# Patient Record
Sex: Male | Born: 1970
Health system: Southern US, Community
[De-identification: ages and names within clinical notes are randomized; demographics above are authoritative.]

## PROBLEM LIST (undated history)

## (undated) ENCOUNTER — Ambulatory Visit (HOSPITAL_COMMUNITY): Payer: BC Managed Care – PPO

## (undated) DIAGNOSIS — F29 Unspecified psychosis not due to a substance or known physiological condition: Secondary | ICD-10-CM

---

## 1997-10-16 ENCOUNTER — Encounter: Admission: RE | Admit: 1997-10-16 | Discharge: 1997-10-16 | Payer: Self-pay | Admitting: Family Medicine

## 1998-04-11 ENCOUNTER — Encounter: Admission: RE | Admit: 1998-04-11 | Discharge: 1998-04-11 | Payer: Self-pay | Admitting: Family Medicine

## 1999-03-24 ENCOUNTER — Emergency Department (HOSPITAL_COMMUNITY): Admission: EM | Admit: 1999-03-24 | Discharge: 1999-03-24 | Payer: Self-pay | Admitting: Emergency Medicine

## 1999-07-17 ENCOUNTER — Encounter: Payer: Self-pay | Admitting: Emergency Medicine

## 1999-07-17 ENCOUNTER — Emergency Department (HOSPITAL_COMMUNITY): Admission: EM | Admit: 1999-07-17 | Discharge: 1999-07-17 | Payer: Self-pay | Admitting: Emergency Medicine

## 1999-07-27 ENCOUNTER — Emergency Department (HOSPITAL_COMMUNITY): Admission: EM | Admit: 1999-07-27 | Discharge: 1999-07-27 | Payer: Self-pay | Admitting: Emergency Medicine

## 1999-08-04 ENCOUNTER — Emergency Department (HOSPITAL_COMMUNITY): Admission: EM | Admit: 1999-08-04 | Discharge: 1999-08-04 | Payer: Self-pay | Admitting: Emergency Medicine

## 2000-02-22 ENCOUNTER — Emergency Department (HOSPITAL_COMMUNITY): Admission: EM | Admit: 2000-02-22 | Discharge: 2000-02-22 | Payer: Self-pay

## 2000-02-22 ENCOUNTER — Encounter: Payer: Self-pay | Admitting: Emergency Medicine

## 2001-06-21 ENCOUNTER — Emergency Department (HOSPITAL_COMMUNITY): Admission: EM | Admit: 2001-06-21 | Discharge: 2001-06-21 | Payer: Self-pay | Admitting: Emergency Medicine

## 2009-03-30 ENCOUNTER — Emergency Department (HOSPITAL_COMMUNITY): Admission: EM | Admit: 2009-03-30 | Discharge: 2009-03-30 | Payer: Self-pay | Admitting: Emergency Medicine

## 2009-04-03 ENCOUNTER — Emergency Department (HOSPITAL_COMMUNITY): Admission: EM | Admit: 2009-04-03 | Discharge: 2009-04-03 | Payer: Self-pay | Admitting: Emergency Medicine

## 2009-10-07 ENCOUNTER — Ambulatory Visit: Payer: Self-pay | Admitting: Internal Medicine

## 2009-10-07 ENCOUNTER — Encounter (INDEPENDENT_AMBULATORY_CARE_PROVIDER_SITE_OTHER): Payer: Self-pay | Admitting: Family Medicine

## 2009-10-07 LAB — CONVERTED CEMR LAB
ALT: 17 units/L (ref 0–53)
AST: 25 units/L (ref 0–37)
Albumin: 4.6 g/dL (ref 3.5–5.2)
CO2: 23 meq/L (ref 19–32)
Calcium: 9.3 mg/dL (ref 8.4–10.5)
Chloride: 106 meq/L (ref 96–112)
Cholesterol: 156 mg/dL (ref 0–200)
Creatinine, Ser: 1.13 mg/dL (ref 0.40–1.50)
Eosinophils Absolute: 0.1 10*3/uL (ref 0.0–0.7)
Lymphocytes Relative: 29 % (ref 12–46)
Lymphs Abs: 2.3 10*3/uL (ref 0.7–4.0)
MCV: 97.7 fL (ref 78.0–100.0)
Monocytes Relative: 5 % (ref 3–12)
Neutrophils Relative %: 65 % (ref 43–77)
Potassium: 4.5 meq/L (ref 3.5–5.3)
RBC: 4.85 M/uL (ref 4.22–5.81)
Sodium: 141 meq/L (ref 135–145)
TSH: 1.878 microintl units/mL (ref 0.350–4.500)
Total Protein: 7.2 g/dL (ref 6.0–8.3)
WBC: 8.1 10*3/uL (ref 4.0–10.5)

## 2009-12-08 ENCOUNTER — Encounter (INDEPENDENT_AMBULATORY_CARE_PROVIDER_SITE_OTHER): Payer: Self-pay | Admitting: Family Medicine

## 2009-12-08 ENCOUNTER — Ambulatory Visit: Payer: Self-pay | Admitting: Internal Medicine

## 2009-12-08 LAB — CONVERTED CEMR LAB
Chlamydia, Swab/Urine, PCR: NEGATIVE
HCV Ab: NEGATIVE
Hep A Total Ab: NEGATIVE
Hep B Core Total Ab: POSITIVE — AB
Hep B S Ab: POSITIVE — AB

## 2010-03-31 ENCOUNTER — Encounter: Admission: RE | Admit: 2010-03-31 | Discharge: 2010-03-31 | Payer: Self-pay | Admitting: General Surgery

## 2010-03-31 ENCOUNTER — Ambulatory Visit (HOSPITAL_COMMUNITY): Admission: RE | Admit: 2010-03-31 | Discharge: 2010-03-31 | Payer: Self-pay | Admitting: General Surgery

## 2010-04-16 ENCOUNTER — Ambulatory Visit (HOSPITAL_BASED_OUTPATIENT_CLINIC_OR_DEPARTMENT_OTHER): Admission: RE | Admit: 2010-04-16 | Discharge: 2010-04-16 | Payer: Self-pay | Admitting: General Surgery

## 2010-08-12 LAB — DIFFERENTIAL
Basophils Absolute: 0 10*3/uL (ref 0.0–0.1)
Basophils Relative: 0 % (ref 0–1)
Basophils Relative: 0 % (ref 0–1)
Eosinophils Absolute: 0.2 10*3/uL (ref 0.0–0.7)
Eosinophils Absolute: 0.2 10*3/uL (ref 0.0–0.7)
Eosinophils Relative: 2 % (ref 0–5)
Lymphs Abs: 2.4 10*3/uL (ref 0.7–4.0)
Monocytes Relative: 8 % (ref 3–12)
Neutro Abs: 4.9 10*3/uL (ref 1.7–7.7)
Neutrophils Relative %: 55 % (ref 43–77)
Neutrophils Relative %: 64 % (ref 43–77)

## 2010-08-12 LAB — CBC
MCH: 31.4 pg (ref 26.0–34.0)
MCH: 32.5 pg (ref 26.0–34.0)
MCHC: 33.7 g/dL (ref 30.0–36.0)
MCHC: 34.4 g/dL (ref 30.0–36.0)
MCV: 94.5 fL (ref 78.0–100.0)
Platelets: 162 10*3/uL (ref 150–400)
Platelets: 187 10*3/uL (ref 150–400)
RBC: 4.55 MIL/uL (ref 4.22–5.81)
RDW: 13 % (ref 11.5–15.5)

## 2010-08-12 LAB — BASIC METABOLIC PANEL
CO2: 28 mEq/L (ref 19–32)
CO2: 29 mEq/L (ref 19–32)
Calcium: 8.9 mg/dL (ref 8.4–10.5)
Calcium: 9 mg/dL (ref 8.4–10.5)
Chloride: 106 mEq/L (ref 96–112)
Creatinine, Ser: 1.02 mg/dL (ref 0.4–1.5)
Creatinine, Ser: 1.17 mg/dL (ref 0.4–1.5)
GFR calc non Af Amer: >60 mL/min (ref >60)
Glucose, Bld: 118 mg/dL — ABNORMAL HIGH (ref 70–99)
Glucose, Bld: 74 mg/dL (ref 70–99)
Sodium: 140 mEq/L (ref 135–145)

## 2010-09-02 LAB — CBC
Hemoglobin: 13.6 g/dL (ref 13.0–17.0)
MCHC: 34.1 g/dL (ref 30.0–36.0)
RBC: 4.09 MIL/uL — ABNORMAL LOW (ref 4.22–5.81)
WBC: 9.5 10*3/uL (ref 4.0–10.5)

## 2010-09-02 LAB — COMPREHENSIVE METABOLIC PANEL
ALT: 34 U/L (ref 0–53)
AST: 34 U/L (ref 0–37)
Alkaline Phosphatase: 63 U/L (ref 39–117)
CO2: 27 mEq/L (ref 19–32)
Calcium: 8.9 mg/dL (ref 8.4–10.5)
Chloride: 108 mEq/L (ref 96–112)
GFR calc Af Amer: >60 mL/min (ref >60)
GFR calc non Af Amer: 58 mL/min — ABNORMAL LOW (ref >60)
Potassium: 5 mEq/L (ref 3.5–5.1)
Sodium: 140 mEq/L (ref 135–145)

## 2010-09-02 LAB — DIFFERENTIAL
Basophils Relative: 1 % (ref 0–1)
Eosinophils Absolute: 0.1 10*3/uL (ref 0.0–0.7)
Eosinophils Relative: 1 % (ref 0–5)
Lymphs Abs: 3.3 10*3/uL (ref 0.7–4.0)

## 2010-09-02 LAB — LIPASE, BLOOD: Lipase: 28 U/L (ref 11–59)

## 2010-09-03 LAB — URINALYSIS, ROUTINE W REFLEX MICROSCOPIC
Glucose, UA: NEGATIVE mg/dL
Specific Gravity, Urine: 1.009 (ref 1.005–1.030)
Urobilinogen, UA: 1 mg/dL (ref 0.0–1.0)

## 2010-09-03 LAB — COMPREHENSIVE METABOLIC PANEL
Albumin: 3.9 g/dL (ref 3.5–5.2)
BUN: 9 mg/dL (ref 6–23)
Creatinine, Ser: 1.34 mg/dL (ref 0.4–1.5)
Potassium: 3.7 mEq/L (ref 3.5–5.1)
Total Protein: 7.3 g/dL (ref 6.0–8.3)

## 2010-09-03 LAB — DIFFERENTIAL
Lymphocytes Relative: 14 % (ref 12–46)
Lymphs Abs: 1.9 10*3/uL (ref 0.7–4.0)
Monocytes Absolute: 1.3 10*3/uL — ABNORMAL HIGH (ref 0.1–1.0)
Monocytes Relative: 10 % (ref 3–12)
Neutro Abs: 10.3 10*3/uL — ABNORMAL HIGH (ref 1.7–7.7)

## 2010-09-03 LAB — CBC
HCT: 47.4 % (ref 39.0–52.0)
MCV: 97.7 fL (ref 78.0–100.0)
Platelets: 197 10*3/uL (ref 150–400)
RDW: 13.1 % (ref 11.5–15.5)

## 2010-09-03 LAB — URINE MICROSCOPIC-ADD ON

## 2011-10-24 IMAGING — CR DG CHEST 2V
2 series · 2 of 2 positions shown · non-contrast
Comparison: None

CLINICAL DATA: Shortness of breath

CHEST - 2 VIEW

[w chest pa]
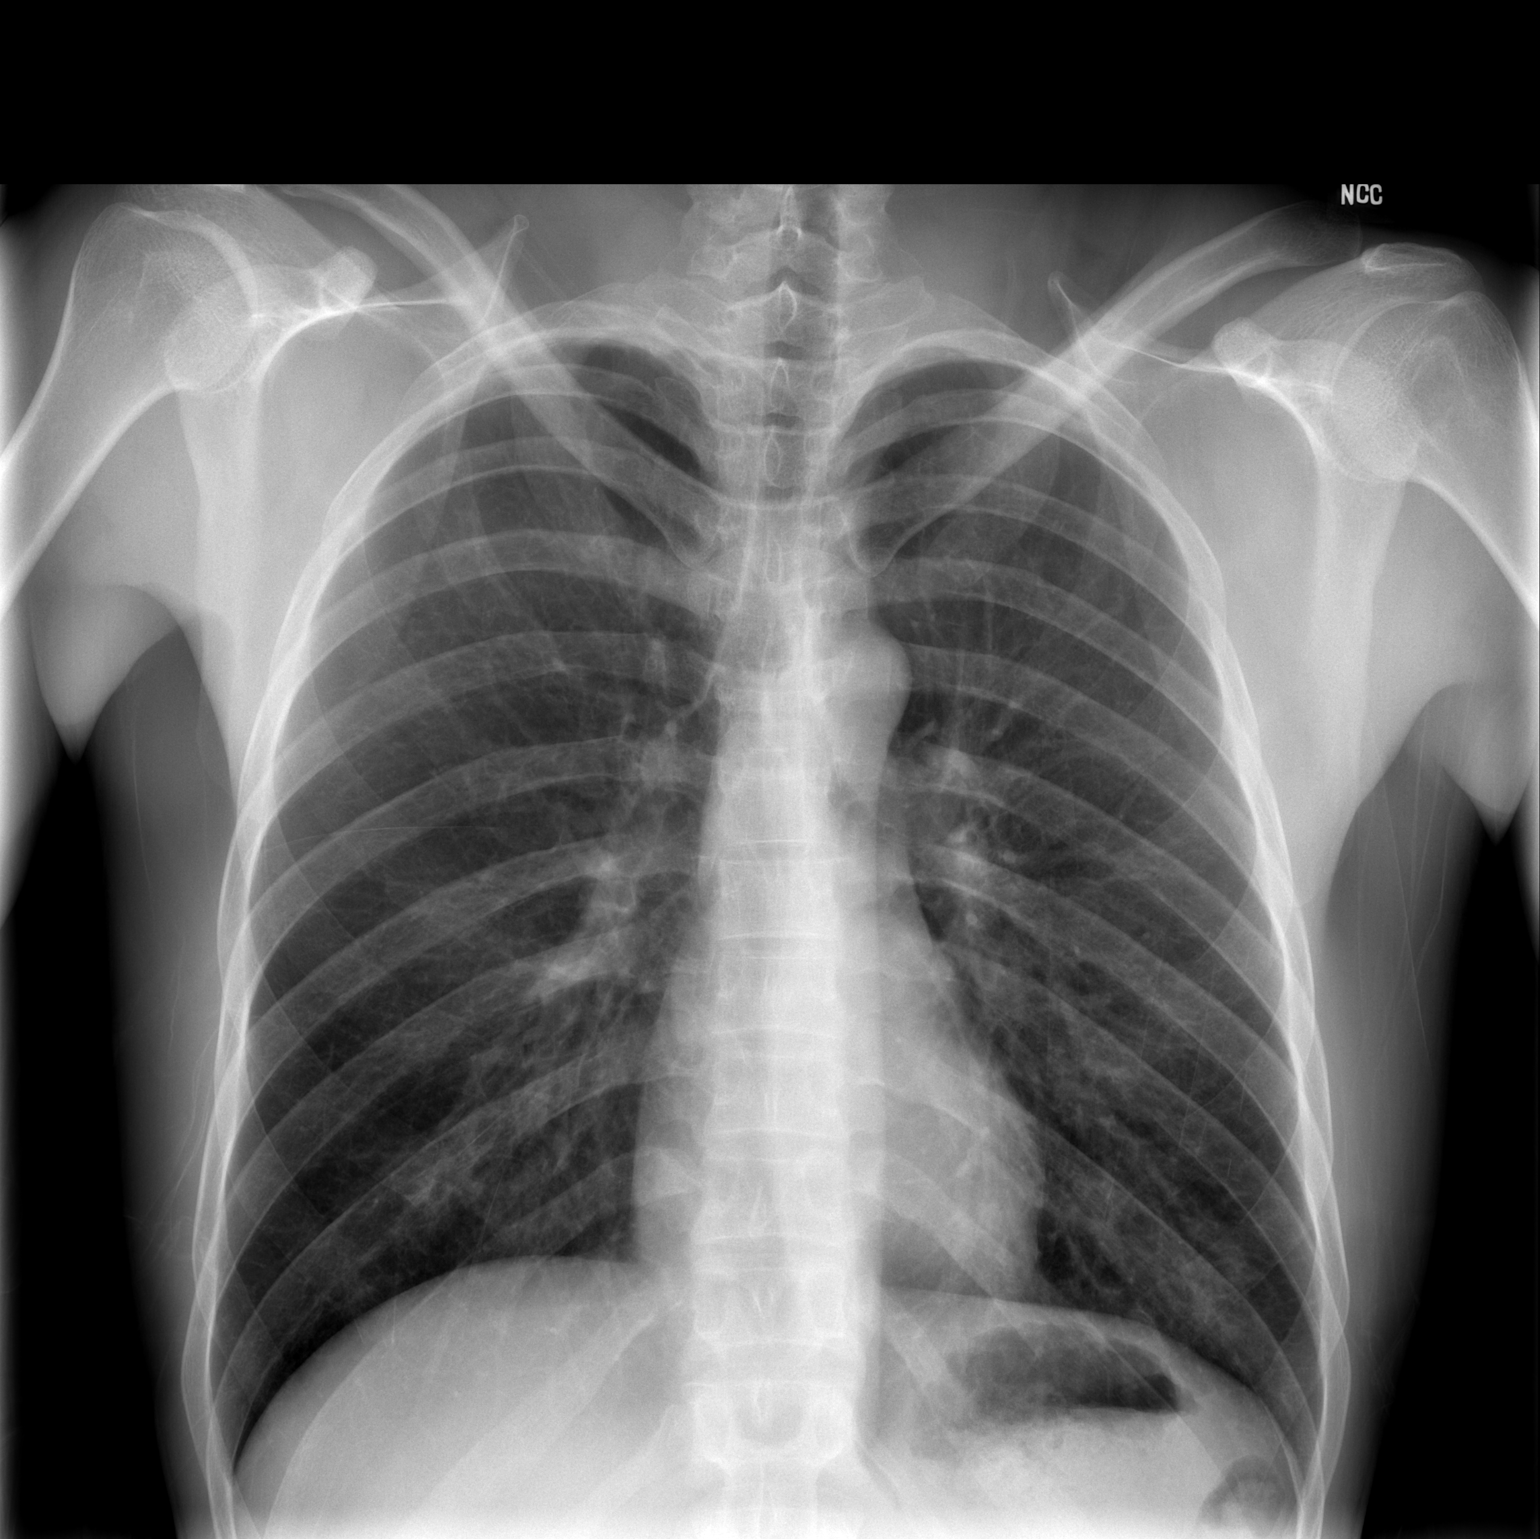

[w chest lat]
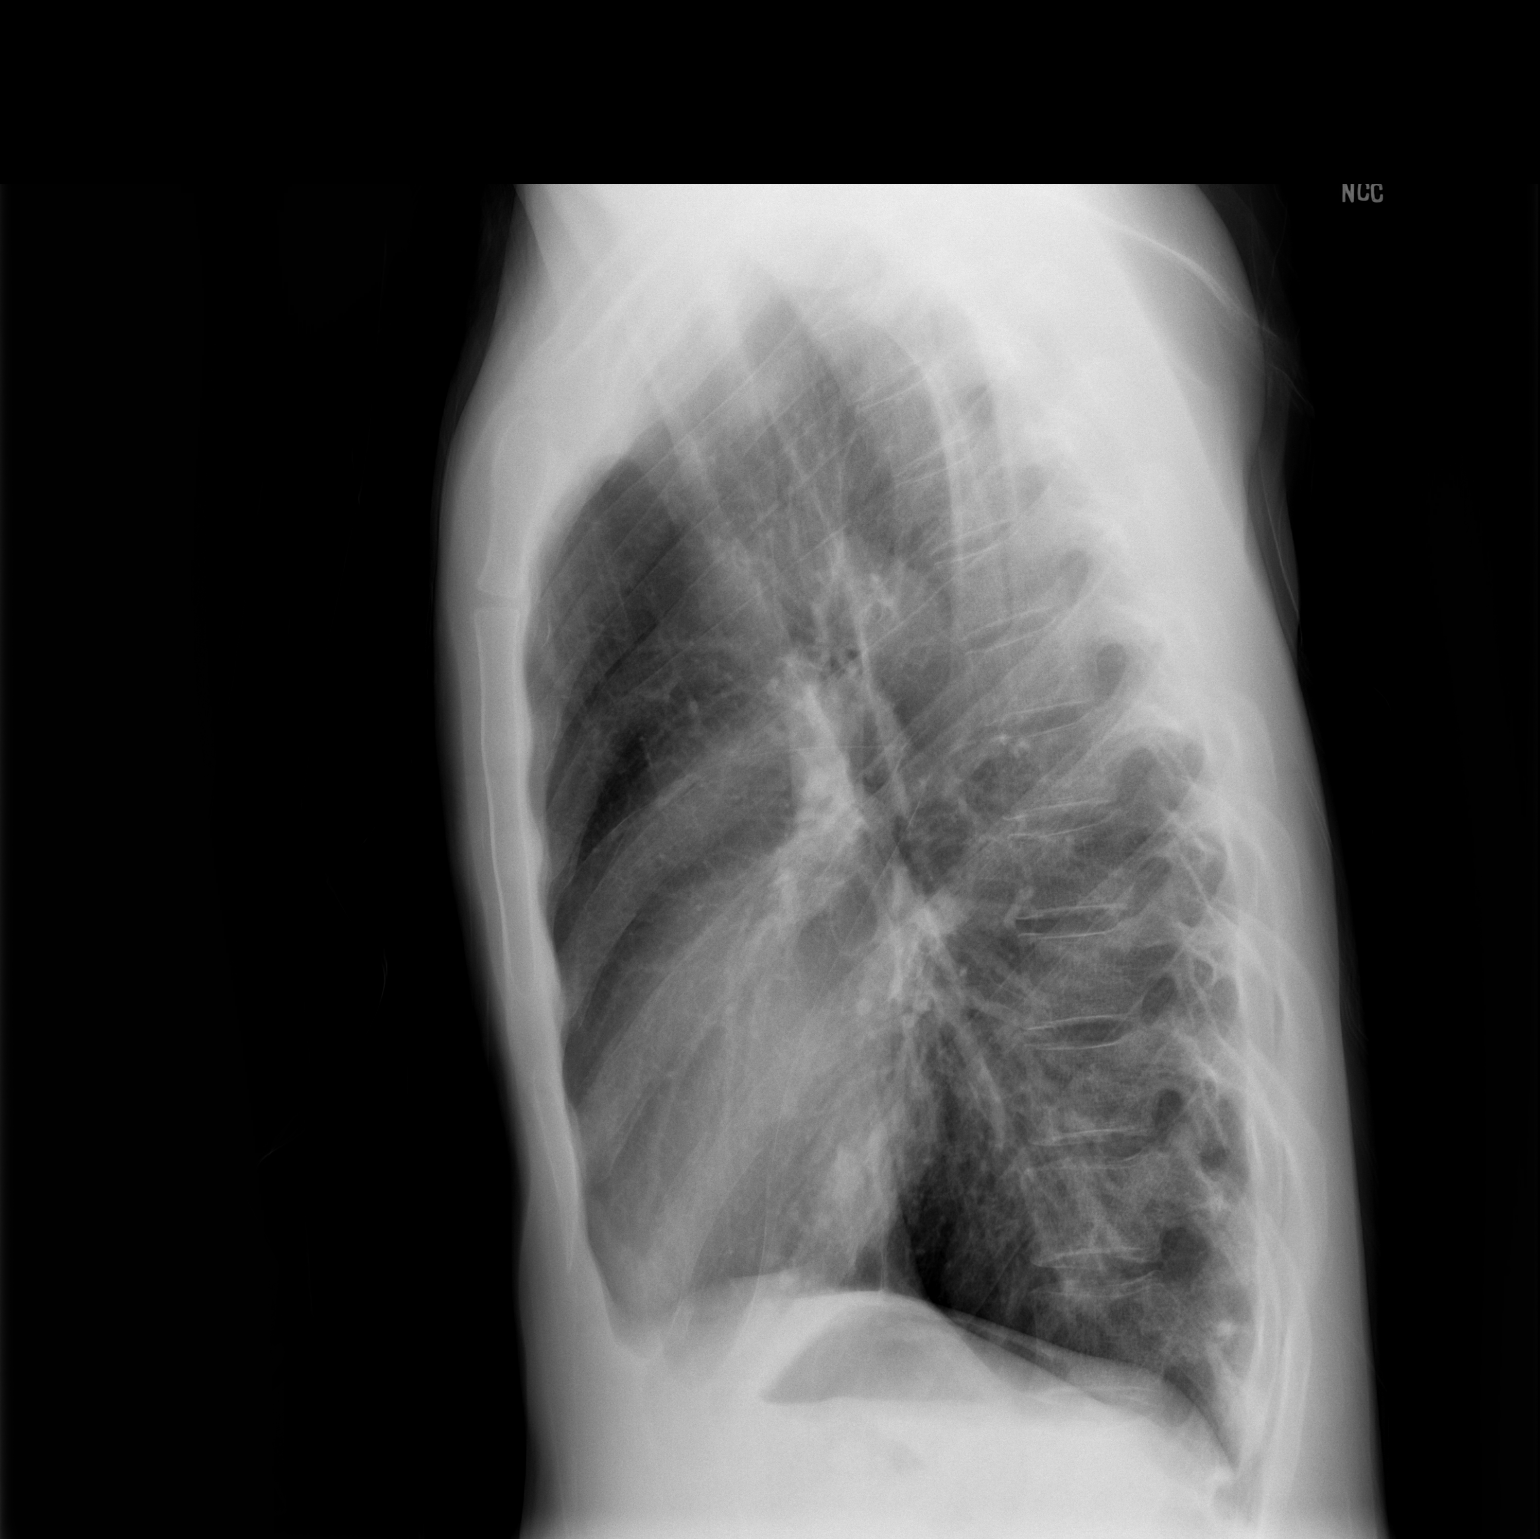

[2 of 2 positions shown; findings below may reference images not displayed]

FINDINGS: The cardiomediastinal silhouette is unremarkable.
Mild patchy opacity within the left lower lobe is suspicious for
early / mild pneumonia.
The lungs are otherwise clear.
There is no evidence of pleural effusion or pneumothorax.
No acute bony abnormalities are identified.
IMPRESSION: Suspect mild / early left lower lobe pneumonia.

## 2011-10-28 IMAGING — US US ABDOMEN COMPLETE
1 series · 14 of 25 positions shown · non-contrast
Comparison: Acute abdominal series 04/03/2009.

CLINICAL DATA: Abdominal pain.

COMPLETE ABDOMINAL ULTRASOUND

[Series 1: us abdomen complete · 0.30mm/px · 14 of 47 slices shown]
[im 1/47]
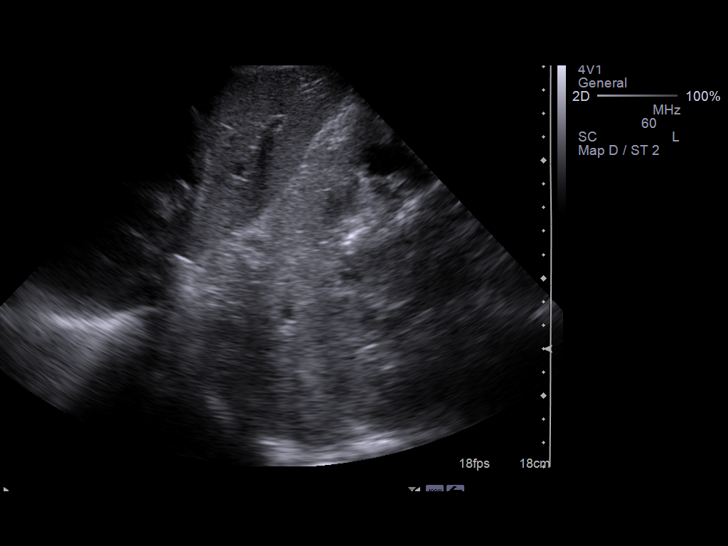
[im 4/47]
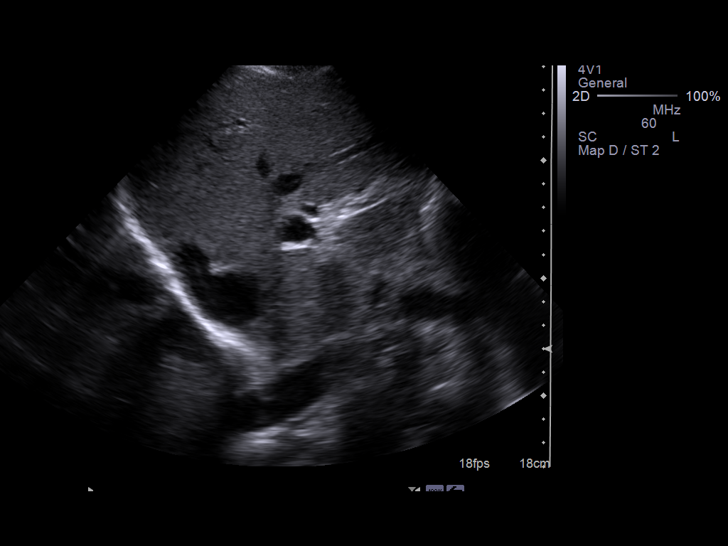
[im 8/47]
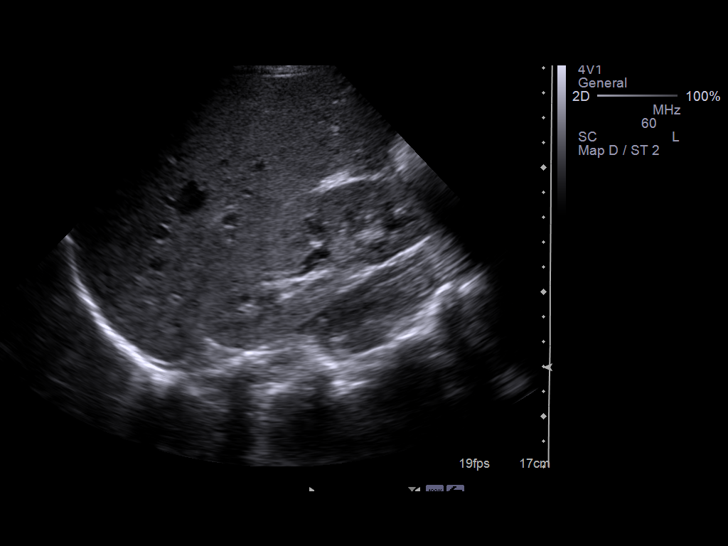
[im 12/47]
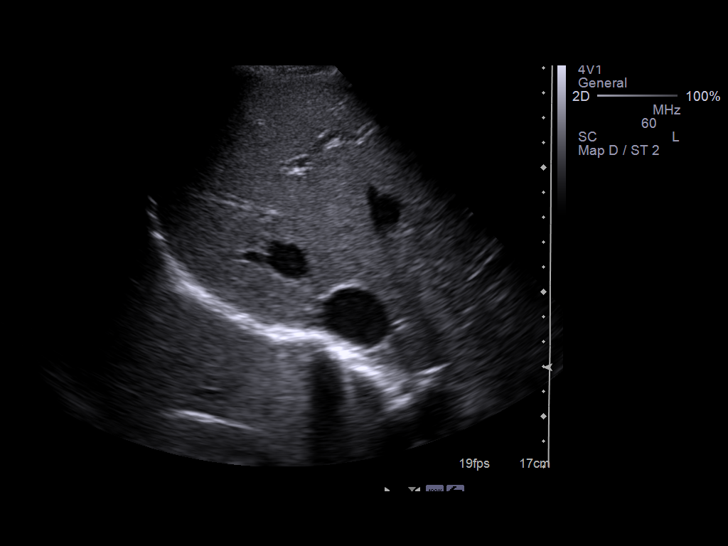
[im 16/47]
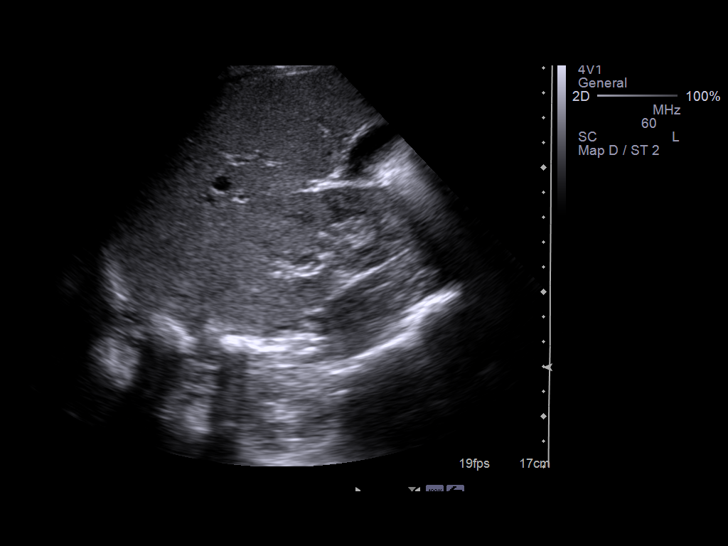
[im 18/47]
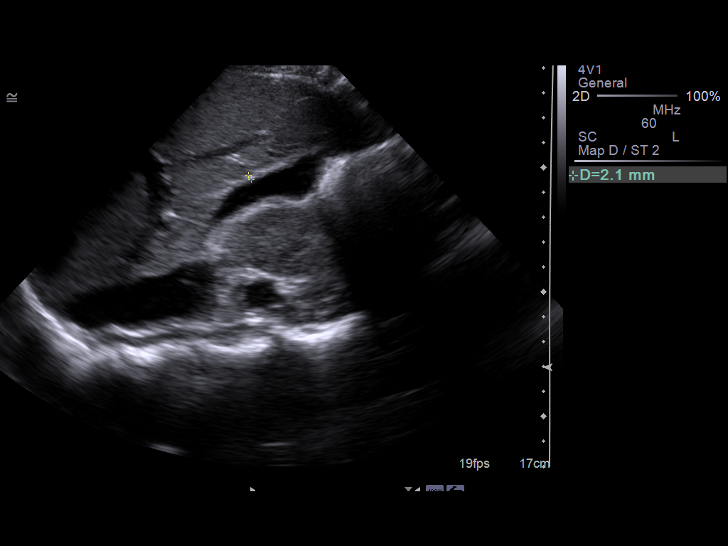
[im 22/47]
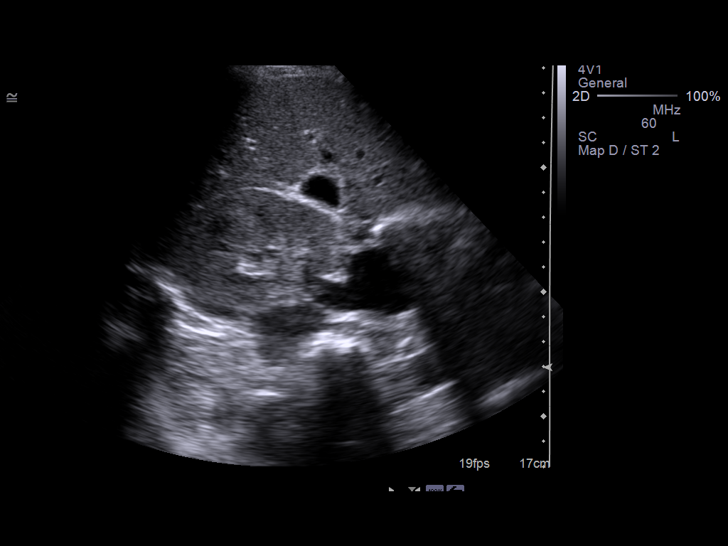
[im 25/47]
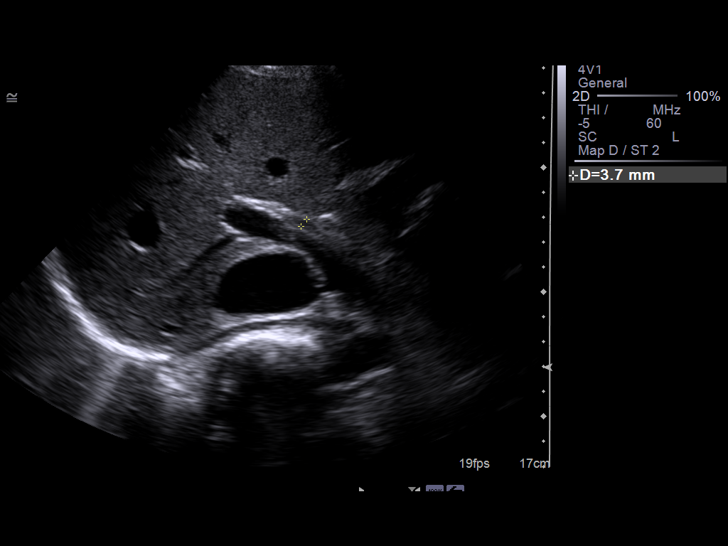
[im 29/47]
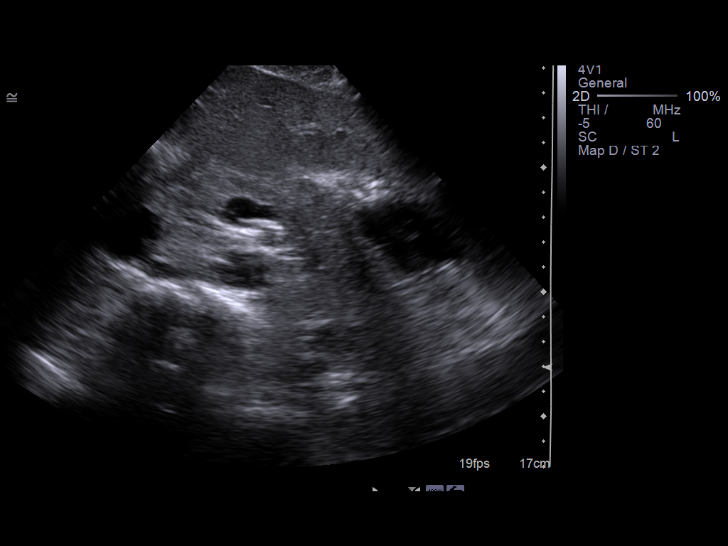
[im 31/47]
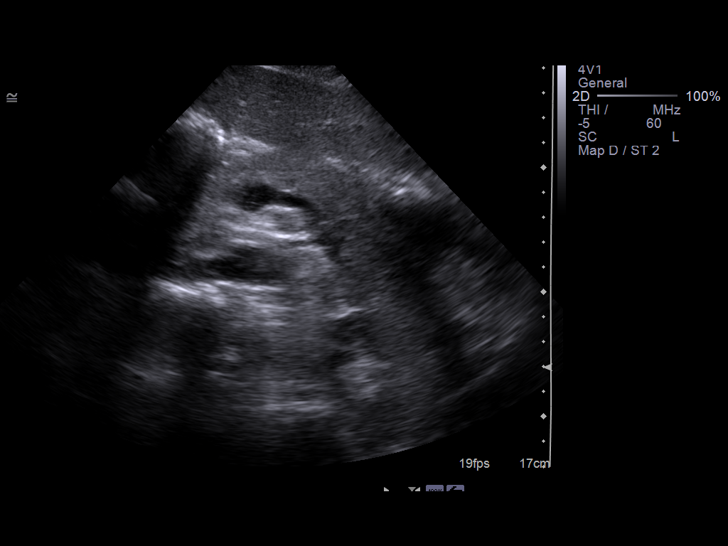
[im 35/47]
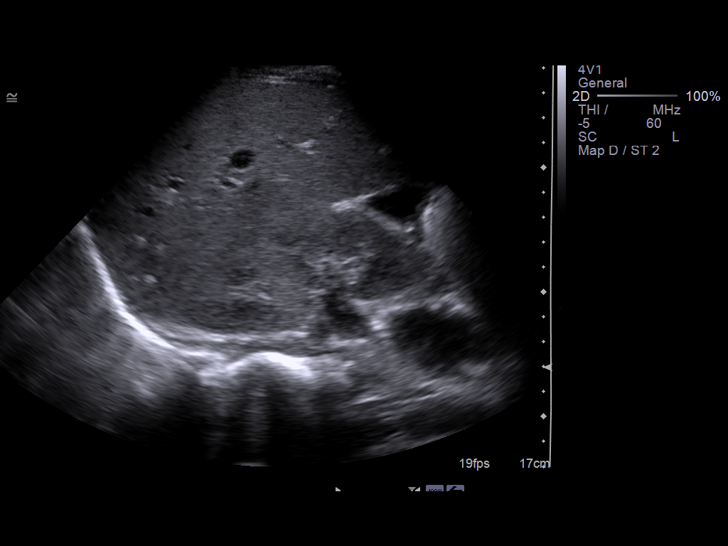
[im 39/47]
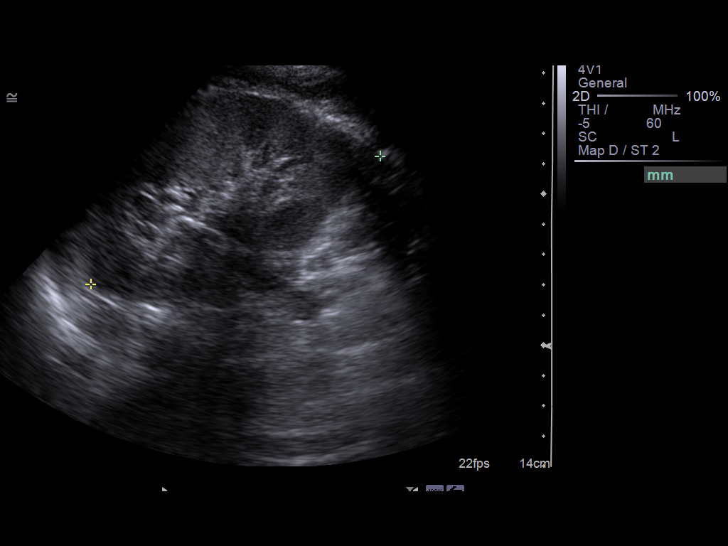
[im 43/47]
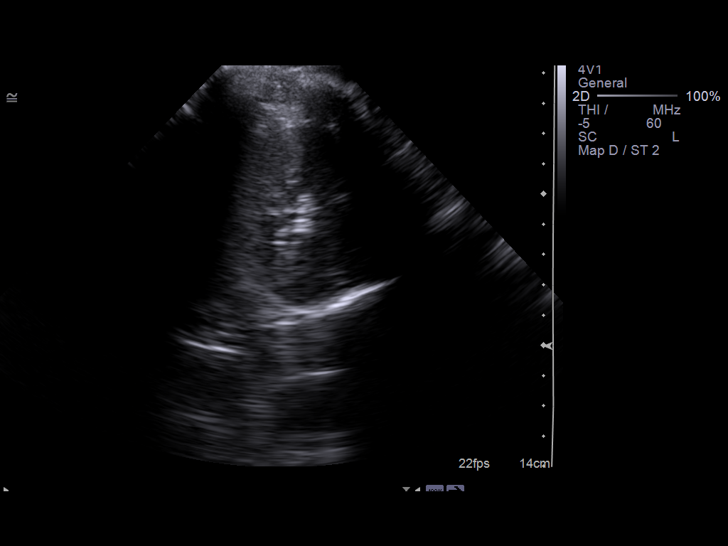
[im 47/47]
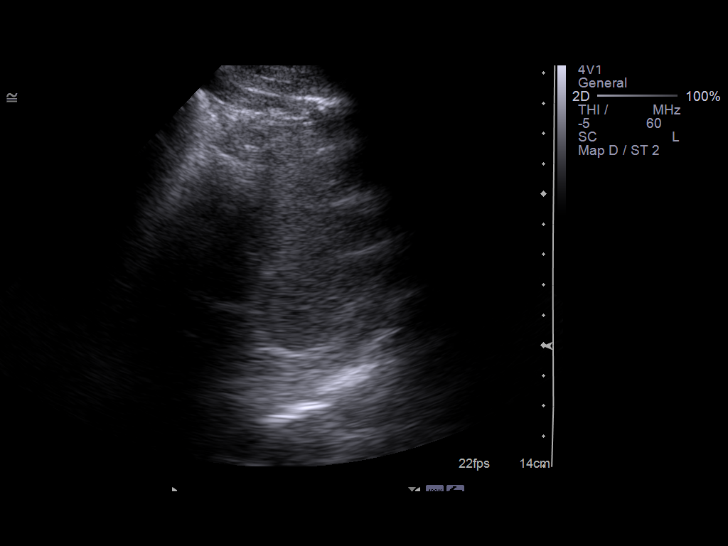

[14 of 25 positions shown; findings below may reference images not displayed]

FINDINGS: Gallbladder:  No gallstones, gallbladder wall thickening, or
pericholecystic fluid. The gallbladder is mildly contracted.  There
is no sonographic Murphy's sign.  The wall thickness is 2.1 mm.

Common bile duct:  Measures 3.7 mm, within normal limits.

Liver:  No focal lesion identified.  Within normal limits in
parenchymal echogenicity.

IVC:  Appears normal.

Pancreas:  No focal abnormality seen.

Spleen:  7.9 cm and demonstrates normal echotexture.

Right Kidney:  Measures 11.2 cm.  The kidney is echogenic.  No
focal lesions are evident.  There is no stone or mass lesion.
There is no hydronephrosis.

Left Kidney:  Measures 10.6 cm.  The kidney is echogenic.  No focal
lesions are evident.  There is no hydronephrosis.

Abdominal aorta:  No aneurysm identified.  The maximal measurement
is 2.2 cm.
IMPRESSION: 1.  The kidneys are echogenic bilaterally.  This is nonspecific,
but can be seen setting of medical renal disease.  Recommend
correlation with laboratory values.
2.  No other acute abnormality.

## 2012-05-22 ENCOUNTER — Encounter (HOSPITAL_COMMUNITY): Payer: Self-pay | Admitting: Emergency Medicine

## 2012-05-22 ENCOUNTER — Ambulatory Visit (HOSPITAL_COMMUNITY)
Admission: RE | Admit: 2012-05-22 | Discharge: 2012-05-22 | Disposition: A | Discharge status: Home / Self Care | Payer: Self-pay | Attending: Psychiatry | Admitting: Psychiatry

## 2012-05-22 ENCOUNTER — Emergency Department (HOSPITAL_COMMUNITY)
Admission: EM | Admit: 2012-05-22 | Discharge: 2012-05-23 | Disposition: A | Discharge status: Home / Self Care | Payer: Self-pay | Attending: Emergency Medicine | Admitting: Emergency Medicine

## 2012-05-22 DIAGNOSIS — IMO0002 Reserved for concepts with insufficient information to code with codable children: Secondary | ICD-10-CM | POA: Insufficient documentation

## 2012-05-22 DIAGNOSIS — F39 Unspecified mood [affective] disorder: Secondary | ICD-10-CM | POA: Insufficient documentation

## 2012-05-22 DIAGNOSIS — F29 Unspecified psychosis not due to a substance or known physiological condition: Secondary | ICD-10-CM | POA: Insufficient documentation

## 2012-05-22 DIAGNOSIS — F209 Schizophrenia, unspecified: Secondary | ICD-10-CM | POA: Insufficient documentation

## 2012-05-22 DIAGNOSIS — F1210 Cannabis abuse, uncomplicated: Secondary | ICD-10-CM | POA: Insufficient documentation

## 2012-05-22 DIAGNOSIS — F411 Generalized anxiety disorder: Secondary | ICD-10-CM | POA: Insufficient documentation

## 2012-05-22 DIAGNOSIS — F319 Bipolar disorder, unspecified: Secondary | ICD-10-CM | POA: Insufficient documentation

## 2012-05-22 DIAGNOSIS — F259 Schizoaffective disorder, unspecified: Secondary | ICD-10-CM | POA: Insufficient documentation

## 2012-05-22 HISTORY — DX: Unspecified psychosis not due to a substance or known physiological condition: F29

## 2012-05-22 LAB — CBC WITH DIFFERENTIAL/PLATELET
Eosinophils Absolute: 0.2 10*3/uL (ref 0.0–0.7)
Lymphs Abs: 2.6 10*3/uL (ref 0.7–4.0)
MCH: 31.7 pg (ref 26.0–34.0)
Neutrophils Relative %: 66 % (ref 43–77)
Platelets: 209 10*3/uL (ref 150–400)
RBC: 4.86 MIL/uL (ref 4.22–5.81)
WBC: 10.5 10*3/uL (ref 4.0–10.5)

## 2012-05-22 LAB — COMPREHENSIVE METABOLIC PANEL
BUN: 8 mg/dL (ref 6–23)
Calcium: 9.3 mg/dL (ref 8.4–10.5)
GFR calc Af Amer: >90 mL/min (ref >90)
Glucose, Bld: 130 mg/dL — ABNORMAL HIGH (ref 70–99)
Potassium: 3.6 mEq/L (ref 3.5–5.1)
Total Protein: 6.8 g/dL (ref 6.0–8.3)

## 2012-05-22 MED ORDER — ONDANSETRON HCL 4 MG PO TABS: 4.0000 mg | ORAL_TABLET | Freq: Three times a day (TID) | ORAL | Status: DC | PRN

## 2012-05-22 MED ORDER — LORAZEPAM 1 MG PO TABS: 1.0000 mg | ORAL_TABLET | Freq: Three times a day (TID) | ORAL | Status: DC | PRN

## 2012-05-22 MED ORDER — IBUPROFEN 600 MG PO TABS
600.0000 mg | ORAL_TABLET | Freq: Three times a day (TID) | ORAL | Status: DC | PRN
  Administered 2012-05-23: 600 mg via ORAL
  Filled 2012-05-22: qty 1

## 2012-05-22 MED ORDER — ACETAMINOPHEN 325 MG PO TABS: 650.0000 mg | ORAL_TABLET | ORAL | Status: DC | PRN
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
## 2012-05-22 NOTE — ED Notes (Signed)
Per police/IVC paperwork, off meds was sent to [facility] for eval-no beds-patient IVC'ed and walked away from lobby-brought here by [employer]

## 2012-05-22 NOTE — ED Provider Notes (Signed)
History     CSN: [id number]  Arrival date & time 05/22/12  1618   First MD Initiated Contact with Patient 05/22/12 1651      Chief Complaint  Patient presents with  . Medical Clearance    (Consider location/radiation/quality/duration/timing/severity/associated sxs/prior treatment) Patient is a 41 y.o. male presenting with mental health disorder. The history is provided by medical records. The history is limited by the condition of the patient. No language interpreter was used.  Mental Health Problem The primary symptoms include dysphoric mood, delusions, hallucinations and bizarre behavior. This is a recurrent problem.  The degree of incapacity that he is experiencing as a consequence of his illness is severe. Additional symptoms of the illness include agitation. Risk factors that are present for mental illness include a history of mental illness.    Past Medical History  Diagnosis Date  . Psychosis     No past surgical history on file.  No family history on file.  History  Substance Use Topics  . Smoking status: Not on file  . Smokeless tobacco: Not on file  . Alcohol Use:       Review of Systems  Psychiatric/Behavioral: Positive for hallucinations, dysphoric mood and agitation. The patient is nervous/anxious.   All other systems reviewed and are negative.    Allergies  Review of patient's allergies indicates not on file.  Home Medications  No current outpatient prescriptions on file.  BP 129/92  Pulse 88  Temp 97.8 F (36.6 C) (Oral)  Resp 20  SpO2 100%  Physical Exam  Nursing note and vitals reviewed. Constitutional: He appears well-developed.  HENT:  Head: Normocephalic and atraumatic.  Eyes: Pupils are equal, round, and reactive to light.  Neck: Neck supple.  Cardiovascular: Normal rate and regular rhythm.   Pulmonary/Chest: Effort normal and breath sounds normal.  Abdominal: Soft. Bowel sounds are normal.  Lymphadenopathy:    He has no  cervical adenopathy.  Neurological: He is alert.  Skin: Skin is warm and dry.  Psychiatric: His mood appears anxious. His speech is rapid and/or pressured. He is agitated. Thought content is delusional. He is noncommunicative. He is inattentive.    ED Course  Procedures (including critical care time)  Labs Reviewed  COMPREHENSIVE METABOLIC PANEL - Abnormal; Notable for the following:    Glucose, Bld 130 (*)     GFR calc non Af Amer 82 (*)     All other components within normal limits  CBC WITH DIFFERENTIAL  ETHANOL  URINE RAPID DRUG SCREEN (HOSP PERFORMED)  URINALYSIS, ROUTINE W REFLEX MICROSCOPIC   No results found.   No diagnosis found. Patient arrives from [facility] with IVC papers after being evaluated by his therapist with acute psychosis, delusions, and paranoia.  Patient assessed, screening labs ordered, will move to psych ED. Holding orders initiated.  Medication reconciliation pending.   MDM          Jimmye Norman, NP 05/23/12 0000

## 2012-05-23 LAB — URINALYSIS, ROUTINE W REFLEX MICROSCOPIC
Nitrite: NEGATIVE
Specific Gravity, Urine: 1.025 (ref 1.005–1.030)
pH: 6 (ref 5.0–8.0)

## 2012-05-23 LAB — RAPID URINE DRUG SCREEN, HOSP PERFORMED
Cocaine: POSITIVE — AB
Opiates: NOT DETECTED
Tetrahydrocannabinol: POSITIVE — AB

## 2012-05-23 LAB — URINE MICROSCOPIC-ADD ON

## 2012-05-23 MED ORDER — LURASIDONE HCL 40 MG PO TABS: 40.0000 mg | ORAL_TABLET | Freq: Every day | ORAL | Status: DC
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
## 2012-05-23 NOTE — BHH Counselor (Signed)
Pt is re-assessed and found to be oriented X'4, awake, calm and cooperative. Pt denies SI, HI, AV hallucinations, delusions or psychosis. Pt reports that "I don't hear no voices today, yesterday they was telling me go get out of there". Pt confirms that when situations become stressful and overwhelming for him "it triggers the voices and they tell me not to trust people. That's why I don't be bothered with stress". Pt is complaining of a "bad headache" and is requesting something for the headache. Pt reports that "it bothers me that I can't remember and loose my thoughts, why do I keep doing that"?    Spoke with Dr. Bernette Mayers EDP and it is determined that the pt is stable, does not meet criteria for IVC mentally ill a danger to himself.  @1600 - Pt was seen by Dr. Loura Pardon, given a prescription for Latuna 40mg  and cleared for discharge. Pt is to follow up with his [facility] counselor Rosey Bath after the Christmas holiday.

## 2012-05-23 NOTE — ED Notes (Signed)
Pt awaiting discharge, daughter on the way with medication for prescriptions.

## 2012-05-23 NOTE — BHH Counselor (Signed)
Patient currently being reviewed at [facility] for possible placement. Spoke with Matthew Hunt and Matthew Hunt patient will be reviewed after bed meeting at 1000.

## 2012-05-23 NOTE — ED Provider Notes (Signed)
Medical screening examination/treatment/procedure(s) were performed by non-physician practitioner and as supervising physician I was immediately available for consultation/collaboration.  Glynn Octave, MD 05/23/12 [phone]

## 2012-10-24 IMAGING — CR DG CHEST 2V
2 series · 2 of 2 positions shown · non-contrast
Comparison: 03/30/2009

CLINICAL DATA: Hemorrhoids.  Preop.

CHEST - 2 VIEW

[w chest pa]
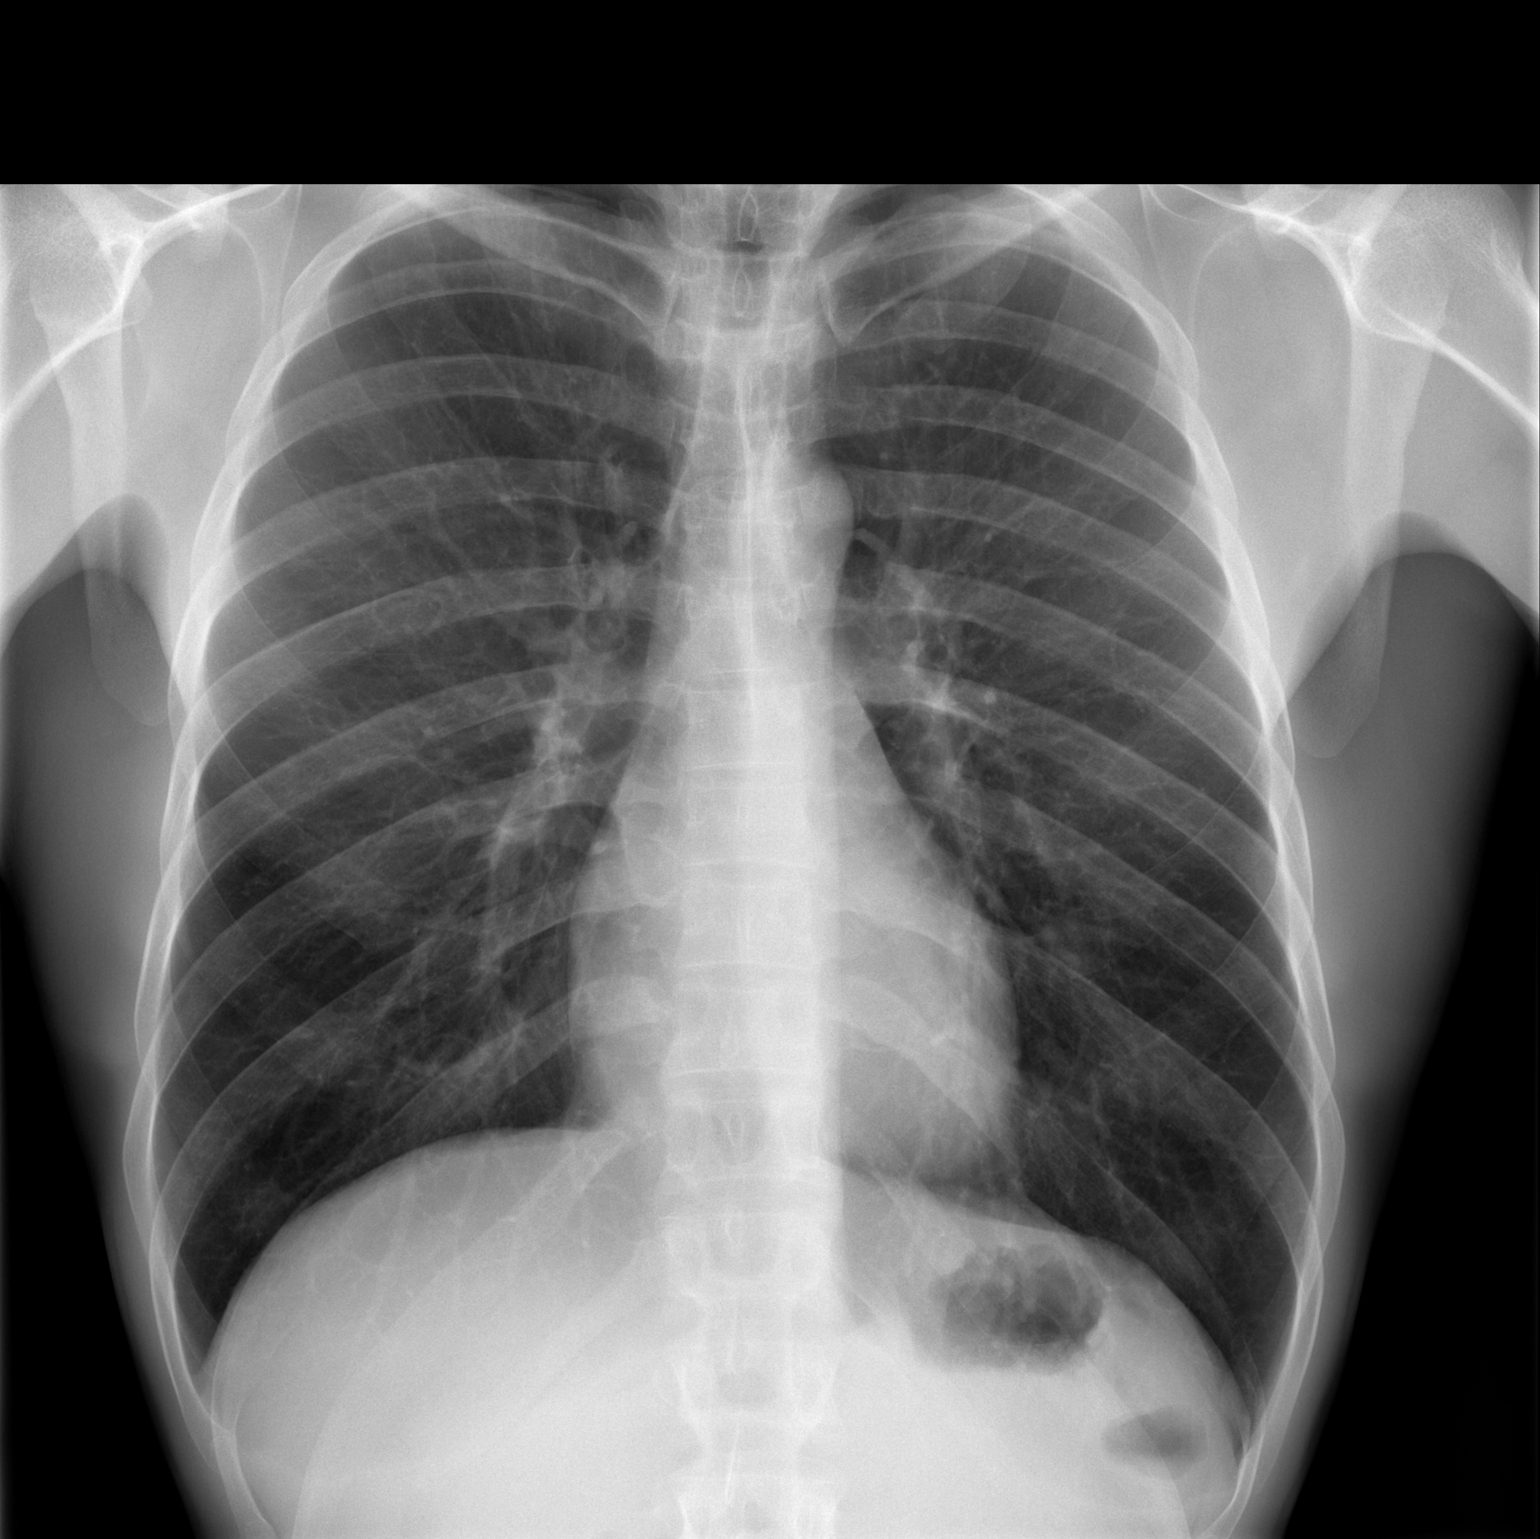

[w chest lat]
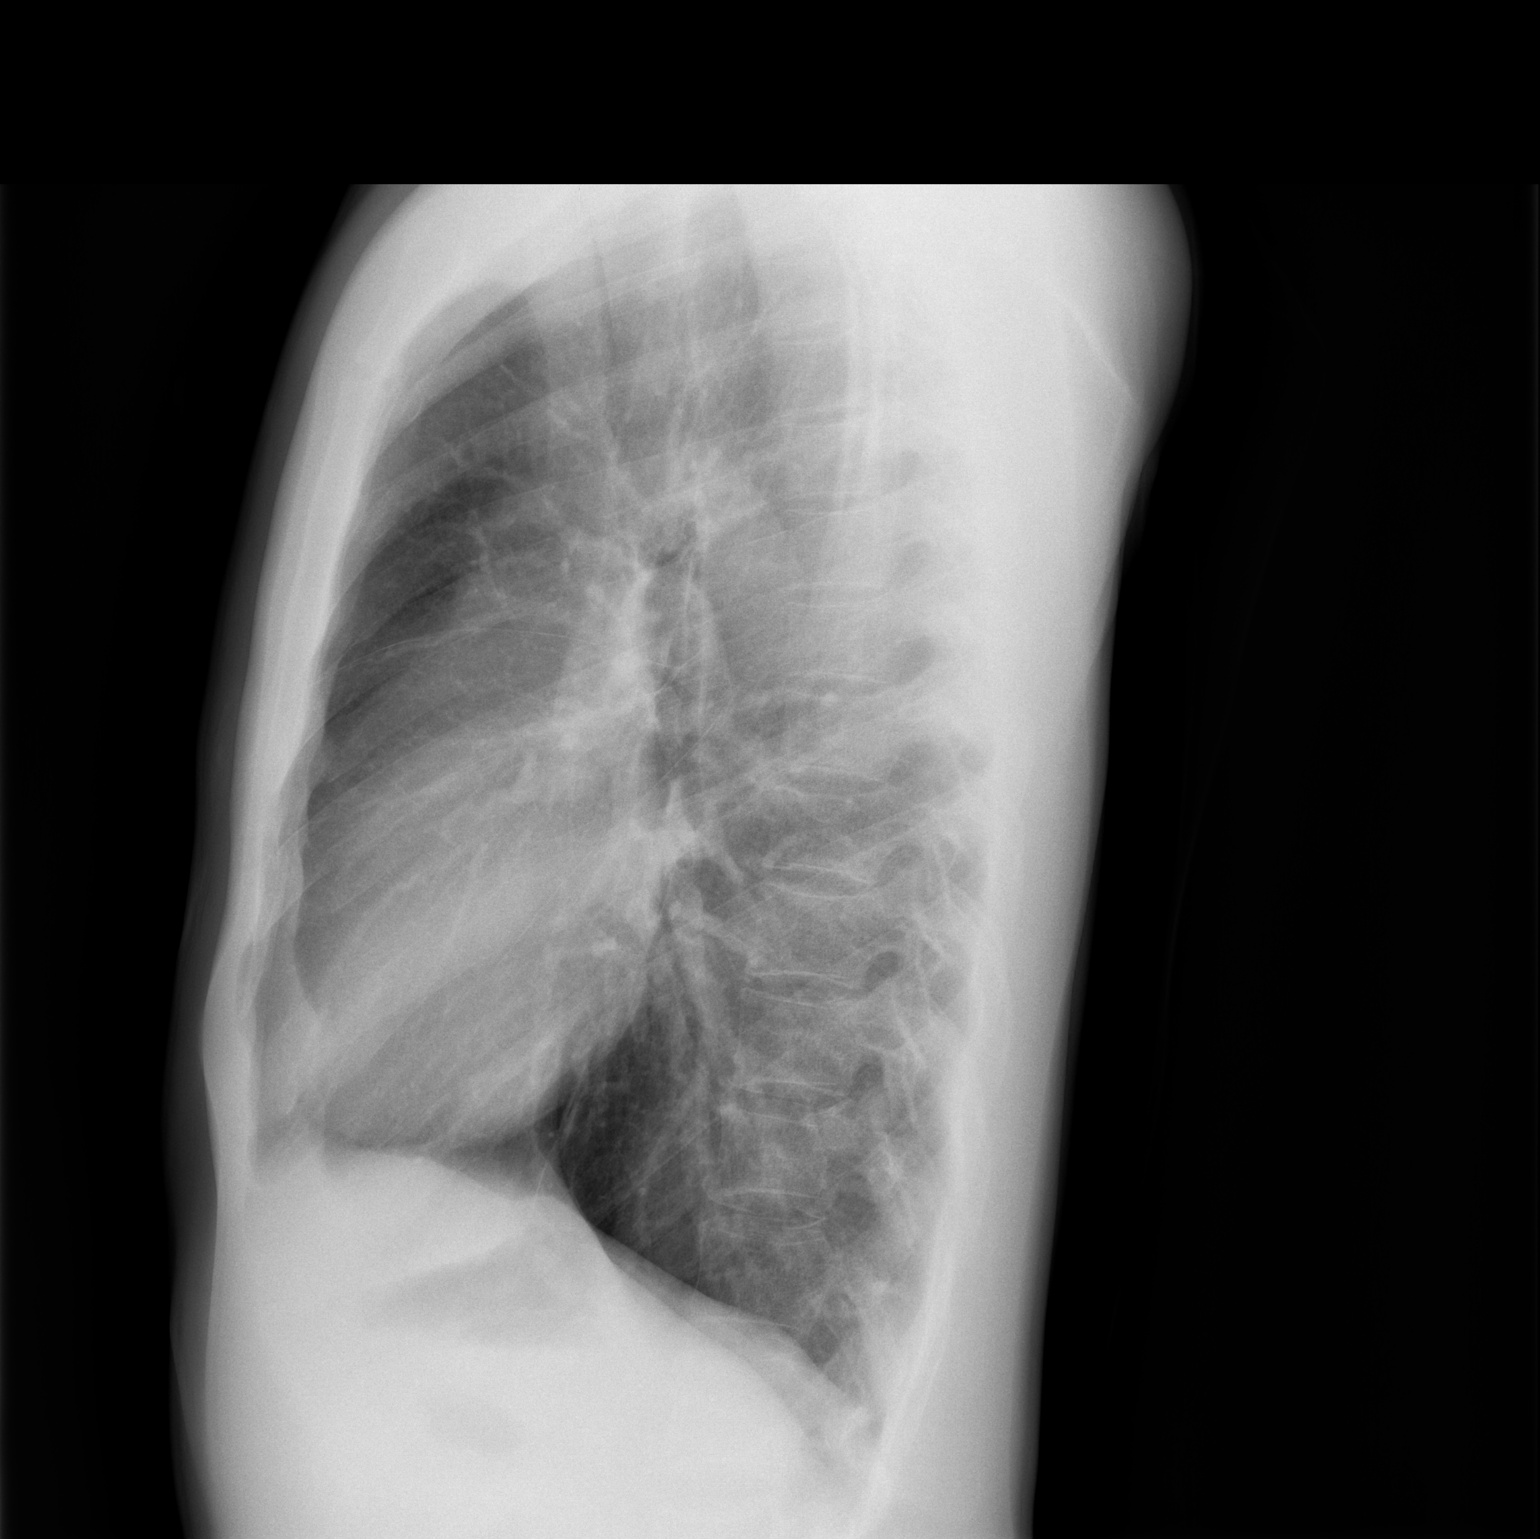

[2 of 2 positions shown; findings below may reference images not displayed]

FINDINGS: Heart and mediastinal contours are within normal limits.
No focal opacities or effusions.  No acute bony abnormality.
IMPRESSION: No acute cardiopulmonary disease.

## 2018-03-17 DIAGNOSIS — Z0289 Encounter for other administrative examinations: Secondary | ICD-10-CM

## 2018-07-03 DIAGNOSIS — N3090 Cystitis, unspecified without hematuria: Secondary | ICD-10-CM | POA: Insufficient documentation

## 2018-07-03 DIAGNOSIS — Z209 Contact with and (suspected) exposure to unspecified communicable disease: Secondary | ICD-10-CM | POA: Insufficient documentation

## 2018-07-03 DIAGNOSIS — E860 Dehydration: Secondary | ICD-10-CM | POA: Insufficient documentation

## 2018-07-04 ENCOUNTER — Emergency Department (HOSPITAL_COMMUNITY)
Admission: EM | Admit: 2018-07-04 | Discharge: 2018-07-04 | Disposition: A | Discharge status: Home / Self Care | Payer: Self-pay | Attending: Emergency Medicine | Admitting: Emergency Medicine

## 2018-07-04 ENCOUNTER — Other Ambulatory Visit: Payer: Self-pay

## 2018-07-04 ENCOUNTER — Encounter (HOSPITAL_COMMUNITY): Payer: Self-pay | Admitting: Emergency Medicine

## 2018-07-04 DIAGNOSIS — R197 Diarrhea, unspecified: Secondary | ICD-10-CM

## 2018-07-04 DIAGNOSIS — E860 Dehydration: Secondary | ICD-10-CM

## 2018-07-04 DIAGNOSIS — N3090 Cystitis, unspecified without hematuria: Secondary | ICD-10-CM

## 2018-07-04 DIAGNOSIS — R112 Nausea with vomiting, unspecified: Secondary | ICD-10-CM

## 2018-07-04 LAB — CBC
HEMATOCRIT: 50.7 % (ref 39.0–52.0)
HEMOGLOBIN: 16.5 g/dL (ref 13.0–17.0)
MCH: 31.1 pg (ref 26.0–34.0)
MCHC: 32.5 g/dL (ref 30.0–36.0)
MCV: 95.5 fL (ref 80.0–100.0)
Platelets: 256 10*3/uL (ref 150–400)
RBC: 5.31 MIL/uL (ref 4.22–5.81)
RDW: 12.8 % (ref 11.5–15.5)
WBC: 9 10*3/uL (ref 4.0–10.5)
nRBC: 0 % (ref 0.0–0.2)

## 2018-07-04 LAB — URINALYSIS, ROUTINE W REFLEX MICROSCOPIC
BILIRUBIN URINE: NEGATIVE
GLUCOSE, UA: NEGATIVE mg/dL
Ketones, ur: 20 mg/dL — AB
NITRITE: NEGATIVE
Protein, ur: 30 mg/dL — AB
SPECIFIC GRAVITY, URINE: 1.029 (ref 1.005–1.030)
pH: 5 (ref 5.0–8.0)

## 2018-07-04 LAB — COMPREHENSIVE METABOLIC PANEL
ALT: 34 U/L (ref 0–44)
AST: 25 U/L (ref 15–41)
Albumin: 3.6 g/dL (ref 3.5–5.0)
Alkaline Phosphatase: 64 U/L (ref 38–126)
Anion gap: 10 (ref 5–15)
BUN: 13 mg/dL (ref 6–20)
CHLORIDE: 108 mmol/L (ref 98–111)
CO2: 20 mmol/L — ABNORMAL LOW (ref 22–32)
Calcium: 8.6 mg/dL — ABNORMAL LOW (ref 8.9–10.3)
Creatinine, Ser: 1.09 mg/dL (ref 0.61–1.24)
Glucose, Bld: 99 mg/dL (ref 70–99)
POTASSIUM: 4.1 mmol/L (ref 3.5–5.1)
Sodium: 138 mmol/L (ref 135–145)
Total Bilirubin: 1 mg/dL (ref 0.3–1.2)
Total Protein: 6.9 g/dL (ref 6.5–8.1)

## 2018-07-04 LAB — LIPASE, BLOOD: LIPASE: 32 U/L (ref 11–51)

## 2018-07-04 MED ORDER — CEPHALEXIN 500 MG PO CAPS: 500.0000 mg | ORAL_CAPSULE | Freq: Three times a day (TID) | ORAL | 0 refills | Status: AC

## 2018-07-04 MED ORDER — MORPHINE SULFATE (PF) 4 MG/ML IV SOLN
4.0000 mg | Freq: Once | INTRAVENOUS | Status: AC
  Administered 2018-07-04: 4 mg via INTRAVENOUS
  Filled 2018-07-04: qty 1

## 2018-07-04 MED ORDER — KETOROLAC TROMETHAMINE 15 MG/ML IJ SOLN
15.0000 mg | Freq: Once | INTRAMUSCULAR | Status: AC
  Administered 2018-07-04: 15 mg via INTRAVENOUS
  Filled 2018-07-04: qty 1

## 2018-07-04 MED ORDER — ONDANSETRON HCL 4 MG/2ML IJ SOLN
4.0000 mg | Freq: Once | INTRAMUSCULAR | Status: AC
  Administered 2018-07-04: 4 mg via INTRAVENOUS
  Filled 2018-07-04: qty 2

## 2018-07-04 MED ORDER — ONDANSETRON 4 MG PO TBDP
4.0000 mg | ORAL_TABLET | Freq: Once | ORAL | Status: DC | PRN
  Filled 2018-07-04: qty 1

## 2018-07-04 MED ORDER — LOPERAMIDE HCL 2 MG PO CAPS: 2.0000 mg | ORAL_CAPSULE | Freq: Four times a day (QID) | ORAL | 0 refills | Status: DC | PRN

## 2018-07-04 MED ORDER — DICYCLOMINE HCL 20 MG PO TABS: 20.0000 mg | ORAL_TABLET | Freq: Three times a day (TID) | ORAL | 0 refills | Status: DC

## 2018-07-04 MED ORDER — SODIUM CHLORIDE 0.9% FLUSH: 3.0000 mL | Freq: Once | INTRAVENOUS | Status: DC

## 2018-07-04 MED ORDER — LACTATED RINGERS IV BOLUS
2000.0000 mL | Freq: Once | INTRAVENOUS | Status: AC
Start: 2018-07-04 — End: 2018-07-04
  Administered 2018-07-04: 2000 mL via INTRAVENOUS

## 2018-07-04 MED ORDER — ONDANSETRON 4 MG PO TBDP: 4.0000 mg | ORAL_TABLET | Freq: Three times a day (TID) | ORAL | 0 refills | Status: DC | PRN

## 2018-07-04 MED ORDER — DIPHENOXYLATE-ATROPINE 2.5-0.025 MG PO TABS
2.0000 | ORAL_TABLET | Freq: Once | ORAL | Status: AC
  Administered 2018-07-04: 2 via ORAL
  Filled 2018-07-04: qty 2

## 2018-07-04 MED ORDER — SODIUM CHLORIDE 0.9 % IV SOLN
2.0000 g | Freq: Once | INTRAVENOUS | Status: AC
  Administered 2018-07-04: 2 g via INTRAVENOUS
  Filled 2018-07-04: qty 20

## 2018-07-04 NOTE — ED Triage Notes (Signed)
Patient complaining of abdominal, nausea, vomiting, and fever. Patient states it started today. His father has same symptoms but started last night. Patient has no other symptoms to report.

## 2018-07-04 NOTE — ED Provider Notes (Signed)
[facility]-EMERGENCY DEPT Provider Note   CSN: [id number] Arrival date & time: 07/03/18  2146     History   Chief Complaint Chief Complaint  Patient presents with  . Emesis  . Nausea  . Diarrhea  . Abdominal Pain    HPI Matthew Hunt is a 48 y.o. male.  HPI   48 yo M with history as below here with nausea, vomiting, diarrhea. Pt states that his sx started abruptly 2-3 days ago.  He has known sick contacts in his son, who has similar symptoms.  His symptoms started as diffuse abdominal cramping followed by profuse, watery, nonbloody, vomiting as well as diarrhea.  He states that since then, every time he tries to eat, he has profuse diarrhea.  He has associated intermittent abdominal cramping, but this is not constant.  He is had subjective fevers and chills.  No urinary symptoms, though he does feel like his urine is more dark than usual.  No flank pain.  No chest pain or cough.  No body aches.  No past medical history of similar symptoms.  Denies any suspicious food intake.  No recent travel outside [place].  Pain is worse with eating.  No alleviating factors.  Past Medical History:  Diagnosis Date  . Psychosis (HCC)     There are no active problems to display for this patient.   History reviewed. No pertinent surgical history.      Home Medications    Prior to Admission medications   Medication Sig Start Date End Date Taking? Authorizing Provider  cephALEXin (KEFLEX) 500 MG capsule Take 1 capsule (500 mg total) by mouth 3 (three) times daily for 7 days. 07/04/18 07/11/18  Shaune Pollack, MD  dicyclomine (BENTYL) 20 MG tablet Take 1 tablet (20 mg total) by mouth 4 (four) times daily -  before meals and at bedtime for 5 days. 07/04/18 07/09/18  Shaune Pollack, MD  loperamide (IMODIUM) 2 MG capsule Take 1 capsule (2 mg total) by mouth 4 (four) times daily as needed for diarrhea or loose stools. 07/04/18   Shaune Pollack, MD  lurasidone (LATUDA) 40 MG  TABS Take 1 tablet (40 mg total) by mouth daily with breakfast. Patient not taking: Reported on 07/04/2018 05/23/12   Susy Frizzle, MD  ondansetron (ZOFRAN ODT) 4 MG disintegrating tablet Take 1 tablet (4 mg total) by mouth every 8 (eight) hours as needed for nausea or vomiting. 07/04/18   Shaune Pollack, MD    Family History History reviewed. No pertinent family history.  Social History Social History   Tobacco Use  . Smoking status: Never Smoker  . Smokeless tobacco: Never Used  Substance Use Topics  . Alcohol use: Not Currently  . Drug use: Not Currently     Allergies   Patient has no known allergies.   Review of Systems Review of Systems  Constitutional: Positive for fatigue. Negative for chills and fever.  HENT: Negative for congestion and rhinorrhea.   Eyes: Negative for visual disturbance.  Respiratory: Negative for cough, shortness of breath and wheezing.   Cardiovascular: Negative for chest pain and leg swelling.  Gastrointestinal: Positive for abdominal pain, diarrhea, nausea and vomiting.  Genitourinary: Negative for dysuria and flank pain.  Musculoskeletal: Negative for neck pain and neck stiffness.  Skin: Negative for rash and wound.  Allergic/Immunologic: Negative for immunocompromised state.  Neurological: Positive for weakness. Negative for syncope and headaches.  All other systems reviewed and are negative.    Physical Exam Updated Vital  Signs BP 104/72 (BP Location: Left Arm)   Pulse 62   Temp 99.2 F (37.3 C) (Oral)   Resp 15   Ht 5\' 11"  (1.803 m)   SpO2 100%   Physical Exam Vitals signs and nursing note reviewed.  Constitutional:      General: He is not in acute distress.    Appearance: He is well-developed.  HENT:     Head: Normocephalic and atraumatic.  Eyes:     Conjunctiva/sclera: Conjunctivae normal.  Neck:     Musculoskeletal: Neck supple.  Cardiovascular:     Rate and Rhythm: Normal rate and regular rhythm.     Heart sounds:  Normal heart sounds. No murmur. No friction rub.  Pulmonary:     Effort: Pulmonary effort is normal. No respiratory distress.     Breath sounds: Normal breath sounds. No wheezing or rales.  Abdominal:     General: Bowel sounds are increased. There is no distension.     Palpations: Abdomen is soft.     Tenderness: There is generalized abdominal tenderness (Minimal). There is no guarding or rebound.  Skin:    General: Skin is warm.     Capillary Refill: Capillary refill takes less than 2 seconds.  Neurological:     Mental Status: He is alert and oriented to person, place, and time.     Motor: No abnormal muscle tone.      ED Treatments / Results  Labs (all labs ordered are listed, but only abnormal results are displayed) Labs Reviewed  COMPREHENSIVE METABOLIC PANEL - Abnormal; Notable for the following components:      Result Value   CO2 20 (*)    Calcium 8.6 (*)    All other components within normal limits  URINALYSIS, ROUTINE W REFLEX MICROSCOPIC - Abnormal; Notable for the following components:   Hgb urine dipstick SMALL (*)    Ketones, ur 20 (*)    Protein, ur 30 (*)    Leukocytes, UA TRACE (*)    Bacteria, UA RARE (*)    All other components within normal limits  URINE CULTURE  LIPASE, BLOOD  CBC    EKG None  Radiology No results found.  Procedures Procedures (including critical care time)  Medications Ordered in ED Medications  sodium chloride flush (NS) 0.9 % injection 3 mL (0 mLs Intravenous Hold 07/04/18 0414)  ondansetron (ZOFRAN-ODT) disintegrating tablet 4 mg (has no administration in time range)  lactated ringers bolus 2,000 mL (0 mLs Intravenous Stopped 07/04/18 0555)  ondansetron (ZOFRAN) injection 4 mg (4 mg Intravenous Given 07/04/18 0456)  ketorolac (TORADOL) 15 MG/ML injection 15 mg (15 mg Intravenous Given 07/04/18 0457)  morphine 4 MG/ML injection 4 mg (4 mg Intravenous Given 07/04/18 0457)  diphenoxylate-atropine (LOMOTIL) 2.5-0.025 MG per tablet 2  tablet (2 tablets Oral Given 07/04/18 0501)  cefTRIAXone (ROCEPHIN) 2 g in sodium chloride 0.9 % 100 mL IVPB (0 g Intravenous Stopped 07/04/18 0806)     Initial Impression / Assessment and Plan / ED Course  I have reviewed the triage vital signs and the nursing notes.  Pertinent labs & imaging results that were available during my care of the patient were reviewed by me and considered in my medical decision making (see chart for details).     48 year old male here with generalized abdominal cramping, nausea, vomiting, and diarrhea.  He has a known sick contact in his son with similar symptoms.  He is afebrile and very well-appearing here.  He does appear mildly dehydrated.  Lab work is overall reassuring.  Of note, patient has possible pyuria.  He does endorse some mild dysuria though I suspect this is due to dehydration and urine concentration, will cover empirically.  He was given a dose of antibiotics here.  He is otherwise well-appearing, and feels markedly improved with IV fluids and symptomatic control.  He has no focal abdominal tenderness to suggest cholecystitis or appendicitis.  No evidence of obstruction.  Given his otherwise well appearance and reassuring labs, will treat as an outpatient with Keflex and outpatient follow-up.  Final Clinical Impressions(s) / ED Diagnoses   Final diagnoses:  Nausea vomiting and diarrhea  Dehydration  Cystitis    ED Discharge Orders         Ordered    ondansetron (ZOFRAN ODT) 4 MG disintegrating tablet  Every 8 hours PRN     07/04/18 0651    dicyclomine (BENTYL) 20 MG tablet  3 times daily before meals & bedtime     07/04/18 0651    loperamide (IMODIUM) 2 MG capsule  4 times daily PRN     07/04/18 0651    cephALEXin (KEFLEX) 500 MG capsule  3 times daily     07/04/18 [id number]           Shaune PollackIsaacs, Elizar Alpern, MD 07/04/18 [phone]

## 2018-07-05 LAB — URINE CULTURE
CULTURE: NO GROWTH
SPECIAL REQUESTS: NORMAL

## 2018-11-29 ENCOUNTER — Other Ambulatory Visit: Payer: Self-pay

## 2018-11-29 DIAGNOSIS — Z20822 Contact with and (suspected) exposure to covid-19: Secondary | ICD-10-CM

## 2018-12-06 LAB — NOVEL CORONAVIRUS, NAA: SARS-CoV-2, NAA: NOT DETECTED

## 2020-07-04 ENCOUNTER — Ambulatory Visit (HOSPITAL_COMMUNITY): Payer: Self-pay

## 2020-07-04 ENCOUNTER — Ambulatory Visit (HOSPITAL_COMMUNITY)
Admission: EM | Admit: 2020-07-04 | Discharge: 2020-07-04 | Disposition: A | Discharge status: Home / Self Care | Payer: Self-pay | Attending: Urgent Care | Admitting: Urgent Care

## 2020-07-04 ENCOUNTER — Other Ambulatory Visit: Payer: Self-pay

## 2020-07-04 ENCOUNTER — Encounter (HOSPITAL_COMMUNITY): Payer: Self-pay | Admitting: Emergency Medicine

## 2020-07-04 DIAGNOSIS — Z202 Contact with and (suspected) exposure to infections with a predominantly sexual mode of transmission: Secondary | ICD-10-CM | POA: Insufficient documentation

## 2020-07-04 DIAGNOSIS — F17200 Nicotine dependence, unspecified, uncomplicated: Secondary | ICD-10-CM | POA: Insufficient documentation

## 2020-07-04 DIAGNOSIS — R319 Hematuria, unspecified: Secondary | ICD-10-CM

## 2020-07-04 DIAGNOSIS — K644 Residual hemorrhoidal skin tags: Secondary | ICD-10-CM | POA: Insufficient documentation

## 2020-07-04 MED ORDER — METRONIDAZOLE 500 MG PO TABS: 500.0000 mg | ORAL_TABLET | Freq: Two times a day (BID) | ORAL | 0 refills | Status: DC

## 2020-07-04 MED ORDER — DOCUSATE SODIUM 100 MG PO CAPS: 100.0000 mg | ORAL_CAPSULE | Freq: Two times a day (BID) | ORAL | 0 refills | Status: DC

## 2020-07-04 MED ORDER — HYDROCORTISONE (PERIANAL) 2.5 % EX CREA: 1.0000 "application " | TOPICAL_CREAM | Freq: Two times a day (BID) | CUTANEOUS | 0 refills | Status: DC

## 2020-07-04 NOTE — ED Provider Notes (Signed)
[facility]   MRN: [id number] DOB: [birth year]  Subjective:   Matthew Hunt is a 50 y.o. male presenting for STD exposure.  Patient states that he had sex with a woman that tested positive for trichomonas.  He has noticed bleeding in his urine, has also had acute on chronic bleeding in his stool.  Patient has longstanding history of intermittent hemorrhoids.  Has previously had to have surgery for both internal and external hemorrhoids.  Denies fever, nausea, vomiting, pelvic or perianal pain.  Patient admits poor diet.  He is a [employer] and eats a lot of fast food.  He also smokes 1 pack/day.  No current facility-administered medications for this encounter.  Current Outpatient Medications:  .  dicyclomine (BENTYL) 20 MG tablet, Take 1 tablet (20 mg total) by mouth 4 (four) times daily -  before meals and at bedtime for 5 days., Disp: 20 tablet, Rfl: 0 .  loperamide (IMODIUM) 2 MG capsule, Take 1 capsule (2 mg total) by mouth 4 (four) times daily as needed for diarrhea or loose stools., Disp: 12 capsule, Rfl: 0 .  lurasidone (LATUDA) 40 MG TABS, Take 1 tablet (40 mg total) by mouth daily with breakfast. (Patient not taking: Reported on 07/04/2018), Disp: 30 tablet, Rfl: 0 .  ondansetron (ZOFRAN ODT) 4 MG disintegrating tablet, Take 1 tablet (4 mg total) by mouth every 8 (eight) hours as needed for nausea or vomiting., Disp: 12 tablet, Rfl: 0   No Known Allergies  Past Medical History:  Diagnosis Date  . Psychosis (HCC)      History reviewed. No pertinent surgical history.  History reviewed. No pertinent family history.  Social History   Tobacco Use  . Smoking status: Never Smoker  . Smokeless tobacco: Never Used  Vaping Use  . Vaping Use: Never used  Substance Use Topics  . Alcohol use: Not Currently  . Drug use: Not Currently    ROS   Objective:   Vitals: BP 130/80 (BP Location: Right Arm)   Pulse 82   Temp 98.8 F (37.1 C) (Oral)   Resp 17    SpO2 98%   Physical Exam Constitutional:      General: He is not in acute distress.    Appearance: Normal appearance. He is well-developed and normal weight. He is not ill-appearing, toxic-appearing or diaphoretic.  HENT:     Head: Normocephalic and atraumatic.     Right Ear: External ear normal.     Left Ear: External ear normal.     Nose: Nose normal.     Mouth/Throat:     Pharynx: Oropharynx is clear.  Eyes:     General: No scleral icterus.       Right eye: No discharge.        Left eye: No discharge.     Extraocular Movements: Extraocular movements intact.     Pupils: Pupils are equal, round, and reactive to light.  Cardiovascular:     Rate and Rhythm: Normal rate.  Pulmonary:     Effort: Pulmonary effort is normal.  Genitourinary:    Penis: Circumcised. No phimosis, paraphimosis, hypospadias, erythema, tenderness, discharge, swelling or lesions.      Rectum: External hemorrhoid (multiple, non are thrombosed, the one outlined appears to have ruptured but is no longer bleeding) present. No tenderness, anal fissure or internal hemorrhoid.    Musculoskeletal:     Cervical back: Normal range of motion.  Skin:    General: Skin is warm and  dry.  Neurological:     Mental Status: He is alert and oriented to person, place, and time.  Psychiatric:        Mood and Affect: Mood normal.        Behavior: Behavior normal.        Thought Content: Thought content normal.        Judgment: Judgment normal.       Assessment and Plan :   PDMP not reviewed this encounter.  1. Painless hematuria   2. External hemorrhoid   3. Exposure to trichomonas   4. Smoker     Patient technically has painless hematuria and with his history of smoking recommended consultation with urology.  I will cover his trichomoniasis exposure with Flagyl today.  Labs pending.  Counseled on need for dietary modifications to help with his hemorrhoids.  Start docusate as a stool softener, increase fiber  intake, hydrocortisone twice a day for a week.  Follow-up with [facility]. Counseled patient on potential for adverse effects with medications prescribed/recommended today, ER and return-to-clinic precautions discussed, patient verbalized understanding.    Wallis Bamberg, PA-C 07/04/20 1059

## 2020-07-04 NOTE — ED Triage Notes (Signed)
Pt presents for STD testing after exposure. States also having blood coming from penis after erection and rectum. States has hx of hemorrhoids.

## 2020-07-04 NOTE — Discharge Instructions (Signed)
Please make an appointment with [facility] to further study your blood from the penis and urine. Set up an appointment with [facility] to get a consultation on your hemorrhoids.    For moderate to severe constipation (not having a bowel movement in more than 3 days) then try to use Miralax or an enema once daily until you have a good bowel movement.  It is not a good idea to use laxatives regularly, for instance daily.  A medication you could use daily to help with promoting bowel movements is docusate (Colace) 50mg -100mg . It is okay to use this 1-2 times daily as needed as a stool softener.  Try to stay active physically including regular exercise 2-3 times a week.  Make sure you hydrate well every day with about 64 ounces of water daily (that is 2 liters).  Try to avoid carb heavy foods, dairy. This includes cutting out breads, pasta, pizza, pastries, potatoes, rice, starchy foods in general. Eat more fiber as listed below:  Salads - kale, spinach, cabbage, spring mix; use seeds like pumpkin seeds or sunflower seeds, almonds; you can also use 1-2 hard boiled eggs in your salads Fruits - avocadoes, berries (blueberries, raspberries, blackberries), apples, oranges, pomegranate, grapefruit Vegetables - aspargus, cauliflower, broccoli, green beans, brussel spouts, bell peppers; stay away from starchy vegetables like potatoes, carrots, peas  Do not eat any foods on this list that you are allergic to.

## 2020-07-06 LAB — CYTOLOGY, (ORAL, ANAL, URETHRAL) ANCILLARY ONLY
Chlamydia: NEGATIVE
Comment: NEGATIVE
Comment: NEGATIVE
Comment: NORMAL
Neisseria Gonorrhea: NEGATIVE
Trichomonas: POSITIVE — AB

## 2020-09-21 ENCOUNTER — Ambulatory Visit (HOSPITAL_COMMUNITY)
Admission: RE | Admit: 2020-09-21 | Discharge: 2020-09-21 | Disposition: A | Discharge status: Home / Self Care | Payer: Self-pay | Source: Ambulatory Visit

## 2020-09-21 ENCOUNTER — Other Ambulatory Visit: Payer: Self-pay

## 2020-09-21 DIAGNOSIS — Z5321 Procedure and treatment not carried out due to patient leaving prior to being seen by health care provider: Secondary | ICD-10-CM

## 2021-10-20 ENCOUNTER — Ambulatory Visit (HOSPITAL_COMMUNITY)
Admission: RE | Admit: 2021-10-20 | Discharge: 2021-10-20 | Disposition: A | Discharge status: Home / Self Care | Payer: Commercial Managed Care - HMO | Source: Ambulatory Visit | Attending: Emergency Medicine | Admitting: Emergency Medicine

## 2021-10-20 ENCOUNTER — Encounter (HOSPITAL_COMMUNITY): Payer: Self-pay

## 2021-10-20 VITALS — BP 131/85 | HR 86 | Temp 98.8°F | Resp 14

## 2021-10-20 DIAGNOSIS — Z202 Contact with and (suspected) exposure to infections with a predominantly sexual mode of transmission: Secondary | ICD-10-CM | POA: Insufficient documentation

## 2021-10-20 DIAGNOSIS — Z113 Encounter for screening for infections with a predominantly sexual mode of transmission: Secondary | ICD-10-CM | POA: Diagnosis present

## 2021-10-20 DIAGNOSIS — B356 Tinea cruris: Secondary | ICD-10-CM | POA: Insufficient documentation

## 2021-10-20 MED ORDER — KETOCONAZOLE 2 % EX CREA: TOPICAL_CREAM | CUTANEOUS | 1 refills | Status: DC

## 2021-10-20 NOTE — ED Provider Notes (Signed)
[facility]    CSN: [id number] Arrival date & time: 10/20/21  1829    HISTORY   No chief complaint on file.  HPI Matthew Hunt is a 51 y.o. male. Patient presents to urgent care today complaining of jock itch.  During triage, when asked what he needed to be seen for today, patient looked at male partner who came into the room with him in motion for her to answer.  Partner states that patient needs to be checked for jackets because she keeps getting to BV.  At this time, patient denies genital rash, penile discharge, genital lesions, scrotal tenderness, scrotal swelling, testicular tenderness, testicular swelling, testicular mass, or burning with urination.  Patient states he did have unprotected oral sex with another partner 4 days ago.  Male partner who is with him today appears notably upset about this.  Male patient denies symptoms at this time other than frequent bouts of bacterial vaginosis, states she is currently being treated for BV at this time.  Patient states he did not have vaginal intercourse with the other partner, only oral.  The history is provided by the patient.  Past Medical History:  Diagnosis Date   Psychosis (HCC)    There are no problems to display for this patient.  History reviewed. No pertinent surgical history.  Home Medications    Prior to Admission medications   Medication Sig Start Date End Date Taking? Authorizing Provider  dicyclomine (BENTYL) 20 MG tablet Take 1 tablet (20 mg total) by mouth 4 (four) times daily -  before meals and at bedtime for 5 days. 07/04/18 07/09/18  Shaune Pollack, MD  docusate sodium (COLACE) 100 MG capsule Take 1 capsule (100 mg total) by mouth every 12 (twelve) hours. 07/04/20   Wallis Bamberg, PA-C  hydrocortisone (ANUSOL-HC) 2.5 % rectal cream Place 1 application rectally 2 (two) times daily. 07/04/20   Wallis Bamberg, PA-C  loperamide (IMODIUM) 2 MG capsule Take 1 capsule (2 mg total) by mouth 4 (four) times daily as  needed for diarrhea or loose stools. 07/04/18   Shaune Pollack, MD  lurasidone (LATUDA) 40 MG TABS Take 1 tablet (40 mg total) by mouth daily with breakfast. Patient not taking: Reported on 07/04/2018 05/23/12   Pollyann Savoy, MD  metroNIDAZOLE (FLAGYL) 500 MG tablet Take 1 tablet (500 mg total) by mouth 2 (two) times daily with a meal. DO NOT CONSUME ALCOHOL WHILE TAKING THIS MEDICATION. 07/04/20   Wallis Bamberg, PA-C  ondansetron (ZOFRAN ODT) 4 MG disintegrating tablet Take 1 tablet (4 mg total) by mouth every 8 (eight) hours as needed for nausea or vomiting. 07/04/18   Shaune Pollack, MD    Family History No family history on file. Social History Social History   Tobacco Use   Smoking status: Never   Smokeless tobacco: Never  Vaping Use   Vaping Use: Never used  Substance Use Topics   Alcohol use: Not Currently   Drug use: Not Currently   Allergies   Patient has no known allergies.  Review of Systems Review of Systems Pertinent findings noted in history of present illness.   Physical Exam Triage Vital Signs ED Triage Vitals  Enc Vitals Group     BP 03/27/21 0827 (!) 147/82     Pulse Rate 03/27/21 0827 72     Resp 03/27/21 0827 18     Temp 03/27/21 0827 98.3 F (36.8 C)     Temp Source 03/27/21 0827 Oral     SpO2 03/27/21  0827 98 %     Weight --      Height --      Head Circumference --      Peak Flow --      Pain Score 03/27/21 0826 5     Pain Loc --      Pain Edu? --      Excl. in GC? --   No data found.  Updated Vital Signs BP 131/85 (BP Location: Left Arm)   Pulse 86   Temp 98.8 F (37.1 C) (Oral)   Resp 14   SpO2 98%   Physical Exam Vitals and nursing note reviewed.  Constitutional:      General: He is not in acute distress.    Appearance: Normal appearance. He is not ill-appearing.  HENT:     Head: Normocephalic and atraumatic.  Eyes:     General: Lids are normal.        Right eye: No discharge.        Left eye: No discharge.     Extraocular  Movements: Extraocular movements intact.     Conjunctiva/sclera: Conjunctivae normal.     Right eye: Right conjunctiva is not injected.     Left eye: Left conjunctiva is not injected.  Neck:     Trachea: Trachea and phonation normal.  Cardiovascular:     Rate and Rhythm: Normal rate and regular rhythm.     Pulses: Normal pulses.     Heart sounds: Normal heart sounds. No murmur heard.   No friction rub. No gallop.  Pulmonary:     Effort: Pulmonary effort is normal. No accessory muscle usage, prolonged expiration or respiratory distress.     Breath sounds: Normal breath sounds. No stridor, decreased air movement or transmitted upper airway sounds. No decreased breath sounds, wheezing, rhonchi or rales.  Chest:     Chest wall: No tenderness.  Genitourinary:    Comments: Pt politely declines GU exam,  penile swab was obtained for STD testing.   Musculoskeletal:        General: Normal range of motion.     Cervical back: Normal range of motion and neck supple. Normal range of motion.  Lymphadenopathy:     Cervical: No cervical adenopathy.  Skin:    General: Skin is warm and dry.     Findings: Rash (Skin and groin area is darkened and scaly without excoriation, erythema or drainage.) present. No erythema.  Neurological:     General: No focal deficit present.     Mental Status: He is alert and oriented to person, place, and time.  Psychiatric:        Mood and Affect: Mood normal.        Behavior: Behavior normal.    Visual Acuity Right Eye Distance:   Left Eye Distance:   Bilateral Distance:    Right Eye Near:   Left Eye Near:    Bilateral Near:     UC Couse / Diagnostics / Procedures:    EKG  Radiology No results found.  Procedures Procedures (including critical care time)  UC Diagnoses / Final Clinical Impressions(s)   I have reviewed the triage vital signs and the nursing notes.  Pertinent labs & imaging results that were available during my care of the patient  were reviewed by me and considered in my medical decision making (see chart for details).    Final diagnoses:  Tinea cruris  Potential exposure to STD  Screening examination for STD (sexually transmitted disease)  STD screening performed at partners request.  Patient asked that I performed the panel swab in front of her so that she would be certain the swab was obtained appropriately.  This I did for him.  Patient advised to begin ketoconazole cream on areas around the surrounding groin for possible tinea cruris, male partner advised that this is not causing her to have frequent episodes of BV.  Return precautions advised.  ED Prescriptions     Medication Sig Dispense Auth. Provider   ketoconazole (NIZORAL) 2 % cream Apply to affected areas in the 1/2 inch area surrounding the affected area once daily until rash is resolved. 60 g Theadora Rama Scales, PA-C      PDMP not reviewed this encounter.  Pending results:  Labs Reviewed  CYTOLOGY, (ORAL, ANAL, URETHRAL) ANCILLARY ONLY    Medications Ordered in UC: Medications - No data to display  Disposition Upon Discharge:  Condition: stable for discharge home Home: take medications as prescribed; routine discharge instructions as discussed; follow up as advised.  Patient presented with an acute illness with associated systemic symptoms and significant discomfort requiring urgent management. In my opinion, this is a condition that a prudent lay person (someone who possesses an average knowledge of health and medicine) may potentially expect to result in complications if not addressed urgently such as respiratory distress, impairment of bodily function or dysfunction of bodily organs.   Routine symptom specific, illness specific and/or disease specific instructions were discussed with the patient and/or caregiver at length.   As such, the patient has been evaluated and assessed, work-up was performed and treatment was provided in  alignment with urgent care protocols and evidence based medicine.  Patient/parent/caregiver has been advised that the patient may require follow up for further testing and treatment if the symptoms continue in spite of treatment, as clinically indicated and appropriate.  Patient/parent/caregiver has been advised to return to the [facility] or PCP if no better; to PCP or the Emergency Department if new signs and symptoms develop, or if the current signs or symptoms continue to change or worsen for further workup, evaluation and treatment as clinically indicated and appropriate  The patient will follow up with their current PCP if and as advised. If the patient does not currently have a PCP we will assist them in obtaining one.   The patient may need specialty follow up if the symptoms continue, in spite of conservative treatment and management, for further workup, evaluation, consultation and treatment as clinically indicated and appropriate.   Patient/parent/caregiver verbalized understanding and agreement of plan as discussed.  All questions were addressed during visit.  Please see discharge instructions below for further details of plan.  Discharge Instructions:   Discharge Instructions      The results of your STD testing today will be made available to you once they are complete, this typically takes 3 to 5 days.  They will initially be posted to your MyChart and, if any of your results are abnormal, you will receive a phone call with those results along with further instructions regarding treatment and any prescriptions, if needed.    Please begin using ketoconazole cream, 1 application to affected areas once daily until rash is resolved.   If you have not had complete resolution of your symptoms after completing treatment, please return for repeat evaluation.   Thank you for visiting urgent care today.  I appreciate the opportunity to participate in your care.       This office note has  been dictated using Teaching laboratory technician.  Unfortunately, and despite my best efforts, this method of dictation can sometimes lead to occasional typographical or grammatical errors.  I apologize in advance if this occurs.     Theadora Rama Scales, PA-C 10/21/21 1059

## 2021-10-20 NOTE — Discharge Instructions (Addendum)
The results of your STD testing today will be made available to you once they are complete, this typically takes 3 to 5 days.  They will initially be posted to your MyChart and, if any of your results are abnormal, you will receive a phone call with those results along with further instructions regarding treatment and any prescriptions, if needed.    Please begin using ketoconazole cream, 1 application to affected areas once daily until rash is resolved.   If you have not had complete resolution of your symptoms after completing treatment, please return for repeat evaluation.   Thank you for visiting urgent care today.  I appreciate the opportunity to participate in your care.

## 2021-10-20 NOTE — ED Triage Notes (Signed)
Upon triaging patient, when asked what he needed to be seen for today, patient looked at male visitor in room and motioned for her to answer.  Male with patient states that patient needs to be checked for jock itch because she keeps getting BV.  Pt denies any rashes, penile discharge, drainage or urination problems.

## 2021-10-29 ENCOUNTER — Encounter (HOSPITAL_COMMUNITY): Payer: Self-pay | Admitting: Emergency Medicine

## 2021-10-29 ENCOUNTER — Ambulatory Visit (HOSPITAL_COMMUNITY)
Admission: EM | Admit: 2021-10-29 | Discharge: 2021-10-29 | Disposition: A | Discharge status: Home / Self Care | Payer: Commercial Managed Care - HMO | Attending: Internal Medicine | Admitting: Internal Medicine

## 2021-10-29 DIAGNOSIS — Z202 Contact with and (suspected) exposure to infections with a predominantly sexual mode of transmission: Secondary | ICD-10-CM | POA: Diagnosis not present

## 2021-10-29 DIAGNOSIS — Z113 Encounter for screening for infections with a predominantly sexual mode of transmission: Secondary | ICD-10-CM | POA: Insufficient documentation

## 2021-10-29 DIAGNOSIS — S0185XA Open bite of other part of head, initial encounter: Secondary | ICD-10-CM | POA: Insufficient documentation

## 2021-10-29 DIAGNOSIS — W503XXA Accidental bite by another person, initial encounter: Secondary | ICD-10-CM

## 2021-10-29 DIAGNOSIS — Y92812 Truck as the place of occurrence of the external cause: Secondary | ICD-10-CM | POA: Insufficient documentation

## 2021-10-29 MED ORDER — AMOXICILLIN-POT CLAVULANATE 875-125 MG PO TABS: 1.0000 | ORAL_TABLET | Freq: Two times a day (BID) | ORAL | 0 refills | Status: DC

## 2021-10-29 MED ORDER — IBUPROFEN 600 MG PO TABS
600.0000 mg | ORAL_TABLET | Freq: Four times a day (QID) | ORAL | 0 refills | Status: DC | PRN
Start: 2021-10-29 — End: 2023-01-20

## 2021-10-29 MED ORDER — ACETAMINOPHEN 500 MG PO TABS: 1000.0000 mg | ORAL_TABLET | Freq: Four times a day (QID) | ORAL | 0 refills | Status: DC | PRN
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
## 2021-10-30 LAB — CYTOLOGY, (ORAL, ANAL, URETHRAL) ANCILLARY ONLY
Chlamydia: NEGATIVE
Chlamydia: NEGATIVE
Comment: NEGATIVE
Comment: NEGATIVE
Comment: NORMAL
Comment: NEGATIVE
Comment: NEGATIVE
Comment: NORMAL
Neisseria Gonorrhea: NEGATIVE
Neisseria Gonorrhea: NEGATIVE
Trichomonas: NEGATIVE
Trichomonas: NEGATIVE

## 2022-04-30 DIAGNOSIS — Z419 Encounter for procedure for purposes other than remedying health state, unspecified: Secondary | ICD-10-CM | POA: Diagnosis not present

## 2022-05-31 DIAGNOSIS — Z419 Encounter for procedure for purposes other than remedying health state, unspecified: Secondary | ICD-10-CM | POA: Diagnosis not present

## 2022-07-01 DIAGNOSIS — Z419 Encounter for procedure for purposes other than remedying health state, unspecified: Secondary | ICD-10-CM | POA: Diagnosis not present

## 2022-07-30 DIAGNOSIS — Z419 Encounter for procedure for purposes other than remedying health state, unspecified: Secondary | ICD-10-CM | POA: Diagnosis not present

## 2022-08-04 ENCOUNTER — Telehealth: Payer: Self-pay

## 2022-08-04 NOTE — Telephone Encounter (Signed)
Mychart msg sent

## 2022-08-30 DIAGNOSIS — Z419 Encounter for procedure for purposes other than remedying health state, unspecified: Secondary | ICD-10-CM | POA: Diagnosis not present

## 2022-09-29 DIAGNOSIS — Z419 Encounter for procedure for purposes other than remedying health state, unspecified: Secondary | ICD-10-CM | POA: Diagnosis not present

## 2022-10-30 DIAGNOSIS — Z419 Encounter for procedure for purposes other than remedying health state, unspecified: Secondary | ICD-10-CM | POA: Diagnosis not present

## 2022-11-14 ENCOUNTER — Ambulatory Visit (HOSPITAL_COMMUNITY): Payer: Commercial Managed Care - HMO

## 2022-11-29 DIAGNOSIS — Z419 Encounter for procedure for purposes other than remedying health state, unspecified: Secondary | ICD-10-CM | POA: Diagnosis not present

## 2023-01-20 ENCOUNTER — Ambulatory Visit (HOSPITAL_COMMUNITY)
Admission: RE | Admit: 2023-01-20 | Discharge: 2023-01-20 | Disposition: A | Discharge status: Home / Self Care | Payer: BC Managed Care – PPO | Source: Ambulatory Visit | Attending: Family Medicine | Admitting: Family Medicine

## 2023-01-20 ENCOUNTER — Encounter (HOSPITAL_COMMUNITY): Payer: Self-pay

## 2023-01-20 VITALS — BP 119/74 | HR 73 | Temp 98.0°F | Resp 16 | Ht 71.0 in | Wt 162.0 lb

## 2023-01-20 DIAGNOSIS — Z202 Contact with and (suspected) exposure to infections with a predominantly sexual mode of transmission: Secondary | ICD-10-CM

## 2023-01-20 DIAGNOSIS — R82998 Other abnormal findings in urine: Secondary | ICD-10-CM | POA: Diagnosis not present

## 2023-01-20 LAB — HIV ANTIBODY (ROUTINE TESTING W REFLEX): HIV Screen 4th Generation wRfx: NONREACTIVE

## 2023-01-20 MED ORDER — METRONIDAZOLE 500 MG PO TABS: 2000.0000 mg | ORAL_TABLET | Freq: Once | ORAL | 0 refills | Status: AC

## 2023-01-20 NOTE — ED Provider Notes (Signed)
[facility]    CSN: [id number] Arrival date & time: 01/20/23  1856      History   Chief Complaint Chief Complaint  Patient presents with   Appointment   SEXUALLY TRANSMITTED DISEASE    HPI Matthew Hunt is a 52 y.o. male.   HPI Here for exposure to trichomonas.  He has recently had some dark urine and slight penile itching.  He states that that has actually been better since he drank more water, though he maybe saw a little discharge when he did a swab here in the clinic.  No dysuria and no abdominal pain or fever or vomiting    Past Medical History:  Diagnosis Date   Psychosis (HCC)     There are no problems to display for this patient.   History reviewed. No pertinent surgical history.     Home Medications    Prior to Admission medications   Medication Sig Start Date End Date Taking? Authorizing Provider  metroNIDAZOLE (FLAGYL) 500 MG tablet Take 4 tablets (2,000 mg total) by mouth once for 1 dose. 01/20/23 01/20/23 Yes Zenia Resides, MD    Family History History reviewed. No pertinent family history.  Social History Social History   Tobacco Use   Smoking status: Every Day    Types: Cigarettes   Smokeless tobacco: Never  Vaping Use   Vaping status: Never Used  Substance Use Topics   Alcohol use: Not Currently   Drug use: Not Currently     Allergies   Patient has no known allergies.   Review of Systems Review of Systems   Physical Exam Triage Vital Signs ED Triage Vitals  Encounter Vitals Group     BP 01/20/23 1916 119/74     Systolic BP Percentile --      Diastolic BP Percentile --      Pulse Rate 01/20/23 1916 73     Resp 01/20/23 1916 16     Temp 01/20/23 1916 98 F (36.7 C)     Temp Source 01/20/23 1916 Oral     SpO2 01/20/23 1916 99 %     Weight 01/20/23 1916 162 lb (73.5 kg)     Height 01/20/23 1916 5\' 11"  (1.803 m)     Head Circumference --      Peak Flow --      Pain Score 01/20/23 1915 0     Pain  Loc --      Pain Education --      Exclude from Growth Chart --    No data found.  Updated Vital Signs BP 119/74 (BP Location: Left Arm)   Pulse 73   Temp 98 F (36.7 C) (Oral)   Resp 16   Ht 5\' 11"  (1.803 m)   Wt 73.5 kg   SpO2 99%   BMI 22.59 kg/m   Visual Acuity Right Eye Distance:   Left Eye Distance:   Bilateral Distance:    Right Eye Near:   Left Eye Near:    Bilateral Near:     Physical Exam Vitals reviewed.  Skin:    Coloration: Skin is not pale.  Neurological:     Mental Status: He is alert and oriented to person, place, and time.  Psychiatric:        Behavior: Behavior normal.      UC Treatments / Results  Labs (all labs ordered are listed, but only abnormal results are displayed) Labs Reviewed  RPR  HIV ANTIBODY (ROUTINE TESTING W REFLEX)  CYTOLOGY, (ORAL, ANAL, URETHRAL) ANCILLARY ONLY    EKG   Radiology No results found.  Procedures Procedures (including critical care time)  Medications Ordered in UC Medications - No data to display  Initial Impression / Assessment and Plan / UC Course  I have reviewed the triage vital signs and the nursing notes.  Pertinent labs & imaging results that were available during my care of the patient were reviewed by me and considered in my medical decision making (see chart for details).        Urethral self swab is done and staff will notify him of any positives and treat per protocol. He wished to go ahead and be treated empirically for trichomonas.  Flagyl sent in for a one-time dose of 2000 mg.  He ended up letting us draw blood to screen for HIV and syphilis. Final Clinical Impressions(s) / UC Diagnoses   Final diagnoses:  Dark urine  Exposure to STD     Discharge Instructions      Metronidazole 500 mg-take 4 tablets together once, best after a good sized meal.  Do not drink alcohol for the next 72 hours as that would make you very ill.  Staff will notify you if there is anything  positive on the swab.      ED Prescriptions     Medication Sig Dispense Auth. Provider   metroNIDAZOLE (FLAGYL) 500 MG tablet Take 4 tablets (2,000 mg total) by mouth once for 1 dose. 4 tablet Jazmyne Beauchesne, Janace Aris, MD      PDMP not reviewed this encounter.   Zenia Resides, MD 01/20/23 [phone]

## 2023-01-20 NOTE — ED Triage Notes (Signed)
STD testing, Patient states urine was darker and slight itching. States the urine color has gotten better and no itching today. States his last partner tested positive for trich but they used a condom.

## 2023-01-20 NOTE — Discharge Instructions (Signed)
Metronidazole 500 mg-take 4 tablets together once, best after a good sized meal.  Do not drink alcohol for the next 72 hours as that would make you very ill.  Staff will notify you if there is anything positive on the swab.

## 2023-01-21 LAB — CYTOLOGY, (ORAL, ANAL, URETHRAL) ANCILLARY ONLY
Chlamydia: NEGATIVE
Comment: NEGATIVE
Comment: NEGATIVE
Comment: NORMAL
Neisseria Gonorrhea: NEGATIVE
Trichomonas: NEGATIVE

## 2023-01-21 LAB — RPR: RPR Ser Ql: NONREACTIVE

## 2024-03-02 ENCOUNTER — Ambulatory Visit (HOSPITAL_COMMUNITY)
Admission: RE | Admit: 2024-03-02 | Discharge: 2024-03-02 | Disposition: A | Discharge status: Home / Self Care | Source: Ambulatory Visit

## 2024-03-02 ENCOUNTER — Encounter (HOSPITAL_COMMUNITY): Payer: Self-pay

## 2024-03-02 VITALS — BP 111/72 | HR 79 | Temp 98.5°F | Resp 16

## 2024-03-02 DIAGNOSIS — Z202 Contact with and (suspected) exposure to infections with a predominantly sexual mode of transmission: Secondary | ICD-10-CM | POA: Insufficient documentation

## 2024-03-02 DIAGNOSIS — R300 Dysuria: Secondary | ICD-10-CM | POA: Diagnosis present

## 2024-03-02 LAB — POCT URINALYSIS DIP (MANUAL ENTRY)
Bilirubin, UA: NEGATIVE
Glucose, UA: NEGATIVE mg/dL
Nitrite, UA: NEGATIVE
Protein Ur, POC: 30 mg/dL — AB
Spec Grav, UA: >=1.03 — AB (ref 1.010–1.025)
Urobilinogen, UA: 0.2 U/dL
pH, UA: 5.5 (ref 5.0–8.0)

## 2024-03-02 LAB — HIV ANTIBODY (ROUTINE TESTING W REFLEX): HIV Screen 4th Generation wRfx: NONREACTIVE

## 2024-03-02 LAB — HEPATITIS C ANTIBODY: HCV Ab: NONREACTIVE

## 2024-03-02 MED ORDER — CEFTRIAXONE SODIUM 500 MG IJ SOLR
INTRAMUSCULAR | Status: AC
  Filled 2024-03-02: qty 500

## 2024-03-02 MED ORDER — CEFTRIAXONE SODIUM 500 MG IJ SOLR
500.0000 mg | INTRAMUSCULAR | Status: DC
  Administered 2024-03-02: 500 mg via INTRAMUSCULAR

## 2024-03-02 MED ORDER — LIDOCAINE HCL (PF) 1 % IJ SOLN
INTRAMUSCULAR | Status: AC
  Filled 2024-03-02: qty 2

## 2024-03-02 MED ORDER — DOXYCYCLINE HYCLATE 100 MG PO CAPS: 100.0000 mg | ORAL_CAPSULE | Freq: Two times a day (BID) | ORAL | 0 refills | Status: DC

## 2024-03-02 NOTE — ED Notes (Addendum)
 ERROR

## 2024-03-02 NOTE — ED Provider Notes (Signed)
 [facility]    CSN: [id number] Arrival date & time: 03/02/24  1846      History   Chief Complaint Chief Complaint  Patient presents with   Abdominal Pain    Pressure while urinatingFeeling like need to pee, only peeing a little bit, little blood in urine Bump on penis - Entered by patient   Mass   SEXUALLY TRANSMITTED DISEASE    HPI Matthew Hunt is a 53 y.o. male.   Patient presents today due to 3 weeks worth of painless lesion on penis, hematuria, dysuria, urinary frequency, and lower abdominal pain.  Patient admits to unprotected sexual activity and concern for STIs.  Patient states that he used for tablets of amoxicillin  that he got from his brother which he states helped with urinary frequency.   Abdominal Pain   Past Medical History:  Diagnosis Date   Psychosis (HCC)     There are no active problems to display for this patient.   History reviewed. No pertinent surgical history.     Home Medications    Prior to Admission medications   Not on File    Family History History reviewed. No pertinent family history.  Social History Social History   Tobacco Use   Smoking status: Every Day    Types: Cigarettes   Smokeless tobacco: Never  Vaping Use   Vaping status: Never Used  Substance Use Topics   Alcohol use: Not Currently   Drug use: Not Currently     Allergies   Patient has no known allergies.   Review of Systems Review of Systems  Gastrointestinal:  Positive for abdominal pain.     Physical Exam Triage Vital Signs ED Triage Vitals  Encounter Vitals Group     BP 03/02/24 1914 111/72     Girls Systolic BP Percentile --      Girls Diastolic BP Percentile --      Boys Systolic BP Percentile --      Boys Diastolic BP Percentile --      Pulse Rate 03/02/24 1914 79     Resp 03/02/24 1914 16     Temp 03/02/24 1914 98.5 F (36.9 C)     Temp Source 03/02/24 1914 Oral     SpO2 03/02/24 1914 96 %     Weight --      Height  --      Head Circumference --      Peak Flow --      Pain Score 03/02/24 1912 0     Pain Loc --      Pain Education --      Exclude from Growth Chart --    No data found.  Updated Vital Signs BP 111/72 (BP Location: Right Arm)   Pulse 79   Temp 98.5 F (36.9 C) (Oral)   Resp 16   SpO2 96%   Visual Acuity Right Eye Distance:   Left Eye Distance:   Bilateral Distance:    Right Eye Near:   Left Eye Near:    Bilateral Near:     Physical Exam Vitals and nursing note reviewed. Exam conducted with a chaperone present.  Constitutional:      General: He is not in acute distress.    Appearance: He is well-developed. He is not ill-appearing, toxic-appearing or diaphoretic.  Eyes:     General: No scleral icterus. Cardiovascular:     Rate and Rhythm: Normal rate and regular rhythm.     Heart sounds: Normal heart  sounds.  Pulmonary:     Effort: Pulmonary effort is normal. No respiratory distress.     Breath sounds: Normal breath sounds. No wheezing or rhonchi.  Abdominal:     General: Abdomen is flat. Bowel sounds are normal.     Palpations: Abdomen is soft.     Tenderness: There is no abdominal tenderness. There is no right CVA tenderness or left CVA tenderness.  Genitourinary:    Penis: Lesions present.      Comments: Single nontender lesion of penis noted Skin:    General: Skin is warm.  Neurological:     Mental Status: He is alert and oriented to person, place, and time.  Psychiatric:        Mood and Affect: Mood normal.        Behavior: Behavior normal.      UC Treatments / Results  Labs (all labs ordered are listed, but only abnormal results are displayed) Labs Reviewed  POCT URINALYSIS DIP (MANUAL ENTRY) - Abnormal; Notable for the following components:      Result Value   Ketones, POC UA trace (5) (*)    Spec Grav, UA >=1.030 (*)    Blood, UA small (*)    Protein Ur, POC =30 (*)    Leukocytes, UA Trace (*)    All other components within normal limits   URINE CULTURE  RPR  HIV ANTIBODY (ROUTINE TESTING W REFLEX)  HEPATITIS C ANTIBODY  CYTOLOGY, (ORAL, ANAL, URETHRAL) ANCILLARY ONLY    EKG   Radiology No results found.  Procedures Procedures (including critical care time)  Medications Ordered in UC Medications - No data to display  Initial Impression / Assessment and Plan / UC Course  I have reviewed the triage vital signs and the nursing notes.  Pertinent labs & imaging results that were available during my care of the patient were reviewed by me and considered in my medical decision making (see chart for details).     STIs/dysuria-will treat prophylactically for STIs and UTI, will test for gonorrhea, chlamydia, trichomonas, syphilis, HIV, and HCV. Final Clinical Impressions(s) / UC Diagnoses   Final diagnoses:  Contact with and (suspected) exposure to infections with a predominantly sexual mode of transmission   Discharge Instructions   None    ED Prescriptions   None    PDMP not reviewed this encounter.   Andra Corean BROCKS, PA-C 03/02/24 1950

## 2024-03-02 NOTE — Discharge Instructions (Addendum)
 You were tested for STIs today. You should receive your results in 3-5 days. If you have not received a call from the office or see results in your mychart please call the clinic where you were seen. It is best if your refrain from sexual activity until you get results back. If positive you will need to refrain from sexual activity for 2 weeks and be sure to complete any antibiotics prescribed in their entirety. '

## 2024-03-02 NOTE — ED Triage Notes (Signed)
 Pt states he thinks he has an STI , he has a bump on his penis X 3 weeks he has been taking amox his brother gave him the last couple days. He states he has lower abdominal pressure and decreased urine output, some blood in his urine X 3 days. He also has genitale itching.   He states he has some penile discharge

## 2024-03-03 LAB — RPR: RPR Ser Ql: NONREACTIVE

## 2024-03-05 ENCOUNTER — Ambulatory Visit (HOSPITAL_COMMUNITY): Payer: Self-pay

## 2024-03-05 LAB — CYTOLOGY, (ORAL, ANAL, URETHRAL) ANCILLARY ONLY
Chlamydia: NEGATIVE
Comment: NEGATIVE
Comment: NEGATIVE
Comment: NORMAL
Neisseria Gonorrhea: NEGATIVE
Trichomonas: NEGATIVE

## 2024-03-05 LAB — URINE CULTURE: Culture: >=100000 — AB

## 2024-03-05 MED ORDER — SULFAMETHOXAZOLE-TRIMETHOPRIM 800-160 MG PO TABS: 1.0000 | ORAL_TABLET | Freq: Two times a day (BID) | ORAL | 0 refills | Status: AC

## 2024-03-05 NOTE — Telephone Encounter (Signed)
 Please advise patient to discontinue doxycycline given that his STI testing was negative.  Patient provided with a prescription for Bactrim to take twice daily for the next 7 days for treatment of acute urinary tract infection.
